# Patient Record
Sex: Female | Born: 1960 | Hispanic: Yes | Marital: Single | Smoking: Current every day smoker
Health system: Southern US, Community
[De-identification: ages and names within clinical notes are randomized; demographics above are authoritative.]

## PROBLEM LIST (undated history)

## (undated) HISTORY — PX: OTHER SURGICAL HISTORY: SHX169

---

## 1997-12-30 ENCOUNTER — Encounter: Admission: RE | Admit: 1997-12-30 | Discharge: 1998-03-30 | Payer: Self-pay | Admitting: Internal Medicine

## 1998-01-16 ENCOUNTER — Ambulatory Visit (HOSPITAL_COMMUNITY): Admission: RE | Admit: 1998-01-16 | Discharge: 1998-01-16 | Payer: Self-pay | Admitting: Gynecology

## 1999-10-20 ENCOUNTER — Other Ambulatory Visit: Admission: RE | Admit: 1999-10-20 | Discharge: 1999-10-20 | Payer: Self-pay | Admitting: Gynecology

## 2000-06-03 ENCOUNTER — Other Ambulatory Visit: Admission: RE | Admit: 2000-06-03 | Discharge: 2000-06-03 | Payer: Self-pay | Admitting: *Deleted

## 2001-05-12 ENCOUNTER — Ambulatory Visit (HOSPITAL_COMMUNITY): Admission: RE | Admit: 2001-05-12 | Discharge: 2001-05-12 | Payer: Self-pay | Admitting: Obstetrics and Gynecology

## 2001-05-12 ENCOUNTER — Encounter: Payer: Self-pay | Admitting: Obstetrics and Gynecology

## 2001-05-18 ENCOUNTER — Encounter: Admission: RE | Admit: 2001-05-18 | Discharge: 2001-08-16 | Payer: Self-pay | Admitting: Obstetrics and Gynecology

## 2001-09-14 ENCOUNTER — Emergency Department (HOSPITAL_COMMUNITY): Admission: EM | Admit: 2001-09-14 | Discharge: 2001-09-14 | Payer: Self-pay | Admitting: Emergency Medicine

## 2001-11-24 ENCOUNTER — Inpatient Hospital Stay (HOSPITAL_COMMUNITY): Admission: AD | Admit: 2001-11-24 | Discharge: 2001-11-27 | Payer: Self-pay | Admitting: Obstetrics & Gynecology

## 2002-10-01 ENCOUNTER — Ambulatory Visit (HOSPITAL_BASED_OUTPATIENT_CLINIC_OR_DEPARTMENT_OTHER): Admission: RE | Admit: 2002-10-01 | Discharge: 2002-10-01 | Payer: Self-pay | Admitting: Orthopedic Surgery

## 2002-10-22 ENCOUNTER — Ambulatory Visit (HOSPITAL_BASED_OUTPATIENT_CLINIC_OR_DEPARTMENT_OTHER): Admission: RE | Admit: 2002-10-22 | Discharge: 2002-10-22 | Payer: Self-pay | Admitting: Orthopedic Surgery

## 2002-11-26 ENCOUNTER — Ambulatory Visit (HOSPITAL_BASED_OUTPATIENT_CLINIC_OR_DEPARTMENT_OTHER): Admission: RE | Admit: 2002-11-26 | Discharge: 2002-11-26 | Payer: Self-pay | Admitting: Orthopedic Surgery

## 2003-12-06 ENCOUNTER — Ambulatory Visit (HOSPITAL_COMMUNITY): Admission: RE | Admit: 2003-12-06 | Discharge: 2003-12-06 | Payer: Self-pay | Admitting: Family Medicine

## 2005-02-17 ENCOUNTER — Emergency Department (HOSPITAL_COMMUNITY): Admission: EM | Admit: 2005-02-17 | Discharge: 2005-02-17 | Payer: Self-pay | Admitting: *Deleted

## 2006-05-29 ENCOUNTER — Emergency Department (HOSPITAL_COMMUNITY): Admission: EM | Admit: 2006-05-29 | Discharge: 2006-05-29 | Payer: Self-pay | Admitting: Emergency Medicine

## 2007-01-15 ENCOUNTER — Ambulatory Visit: Payer: Self-pay | Admitting: Psychiatry

## 2007-01-15 ENCOUNTER — Inpatient Hospital Stay (HOSPITAL_COMMUNITY): Admission: EM | Admit: 2007-01-15 | Discharge: 2007-01-18 | Payer: Self-pay | Admitting: *Deleted

## 2011-12-14 ENCOUNTER — Encounter (HOSPITAL_BASED_OUTPATIENT_CLINIC_OR_DEPARTMENT_OTHER): Payer: Self-pay | Admitting: *Deleted

## 2011-12-14 ENCOUNTER — Emergency Department (HOSPITAL_BASED_OUTPATIENT_CLINIC_OR_DEPARTMENT_OTHER)
Admission: EM | Admit: 2011-12-14 | Discharge: 2011-12-15 | Disposition: A | Discharge status: Home / Self Care | Payer: Medicaid Other | Attending: Emergency Medicine | Admitting: Emergency Medicine

## 2011-12-14 DIAGNOSIS — R112 Nausea with vomiting, unspecified: Secondary | ICD-10-CM | POA: Insufficient documentation

## 2011-12-14 DIAGNOSIS — Z79899 Other long term (current) drug therapy: Secondary | ICD-10-CM | POA: Insufficient documentation

## 2011-12-14 DIAGNOSIS — E119 Type 2 diabetes mellitus without complications: Secondary | ICD-10-CM | POA: Insufficient documentation

## 2011-12-14 DIAGNOSIS — R739 Hyperglycemia, unspecified: Secondary | ICD-10-CM

## 2011-12-14 LAB — COMPREHENSIVE METABOLIC PANEL
ALT: 11 U/L (ref 0–35)
AST: 12 U/L (ref 0–37)
Albumin: 4.3 g/dL (ref 3.5–5.2)
Alkaline Phosphatase: 58 U/L (ref 39–117)
BUN: 16 mg/dL (ref 6–23)
Chloride: 89 mEq/L — ABNORMAL LOW (ref 96–112)
Potassium: 4.1 mEq/L (ref 3.5–5.1)
Sodium: 130 mEq/L — ABNORMAL LOW (ref 135–145)
Total Bilirubin: 1.4 mg/dL — ABNORMAL HIGH (ref 0.3–1.2)
Total Protein: 7.8 g/dL (ref 6.0–8.3)

## 2011-12-14 LAB — URINE MICROSCOPIC-ADD ON

## 2011-12-14 LAB — CBC
Hemoglobin: 14 g/dL (ref 12.0–15.0)
MCH: 30 pg (ref 26.0–34.0)
MCHC: 34.2 g/dL (ref 30.0–36.0)
Platelets: 241 10*3/uL (ref 150–400)

## 2011-12-14 LAB — GLUCOSE, CAPILLARY: Glucose-Capillary: 386 mg/dL — ABNORMAL HIGH (ref 70–99)

## 2011-12-14 LAB — URINALYSIS, ROUTINE W REFLEX MICROSCOPIC
Bilirubin Urine: NEGATIVE
Glucose, UA: >1000 mg/dL — AB
Hgb urine dipstick: NEGATIVE
Ketones, ur: 40 mg/dL — AB
Leukocytes, UA: NEGATIVE
Nitrite: NEGATIVE
Protein, ur: NEGATIVE mg/dL
Specific Gravity, Urine: 1.039 — ABNORMAL HIGH (ref 1.005–1.030)
Urobilinogen, UA: 0.2 mg/dL (ref 0.0–1.0)
pH: 5.5 (ref 5.0–8.0)

## 2011-12-14 LAB — DIFFERENTIAL
Basophils Absolute: 0 10*3/uL (ref 0.0–0.1)
Basophils Relative: 0 % (ref 0–1)
Eosinophils Absolute: 0 10*3/uL (ref 0.0–0.7)
Monocytes Relative: 5 % (ref 3–12)
Neutro Abs: 7.2 10*3/uL (ref 1.7–7.7)
Neutrophils Relative %: 79 % — ABNORMAL HIGH (ref 43–77)

## 2011-12-14 LAB — PREGNANCY, URINE: Preg Test, Ur: NEGATIVE

## 2011-12-14 MED ORDER — SODIUM CHLORIDE 0.9 % IV BOLUS (SEPSIS): 1000.0000 mL | Freq: Once | INTRAVENOUS | Status: DC

## 2011-12-14 NOTE — ED Notes (Signed)
The patient's CBG was 386.

## 2011-12-14 NOTE — ED Notes (Signed)
Pt. Reports being diabetic for 15 yrs.

## 2011-12-14 NOTE — ED Notes (Signed)
Nausea, vomiting for about 1 week. Not able to eat because of this.

## 2011-12-15 MED ORDER — INSULIN REGULAR HUMAN 100 UNIT/ML IJ SOLN
5.0000 [IU] | Freq: Once | INTRAMUSCULAR | Status: AC
  Administered 2011-12-15: 5 [IU] via SUBCUTANEOUS

## 2011-12-15 MED ORDER — ONDANSETRON HCL 4 MG/2ML IJ SOLN
4.0000 mg | Freq: Once | INTRAMUSCULAR | Status: AC
  Administered 2011-12-15: 4 mg via INTRAVENOUS
  Filled 2011-12-15: qty 2

## 2011-12-15 MED ORDER — INSULIN REGULAR HUMAN 100 UNIT/ML IJ SOLN
INTRAMUSCULAR | Status: AC
  Filled 2011-12-15: qty 1

## 2011-12-15 MED ORDER — ONDANSETRON 8 MG PO TBDP: 8.0000 mg | ORAL_TABLET | Freq: Three times a day (TID) | ORAL | Status: AC | PRN

## 2011-12-15 NOTE — ED Provider Notes (Signed)
History     CSN: [id number]  Arrival date & time 12/14/11  2104   First MD Initiated Contact with Patient 12/15/11 0029      Chief Complaint  Patient presents with  . Nausea and vomiting     (Consider location/radiation/quality/duration/timing/severity/associated sxs/prior treatment) HPI Is a 51 year old female with a history of diabetes. She has had vomiting for the past 8 days. She states she has vomited a total of about 11 times. There's been no associated abdominal pain or diarrhea. She has not even in about 4 days due to the vomiting. She has not taken anything to treat the vomiting. She states she has had a headache but denies other associated symptoms. She was given a liter of normal saline by nursing triage protocols on arrival. She states she feels back to normal and wishes to go home at this time. She now just that she has not taken her medications the last day or 2 and attributes her elevated sugar to this.  Past Medical History  Diagnosis Date  . Diabetes mellitus     Past Surgical History  Procedure Date  . Carpel tunnel   . Cesarean section     No family history on file.  History  Substance Use Topics  . Smoking status: Not on file  . Smokeless tobacco: Not on file  . Alcohol Use:     OB History    Grav Para Term Preterm Abortions TAB SAB Ect Mult Living                  Review of Systems  All other systems reviewed and are negative.    Allergies  Review of patient's allergies indicates no known allergies.  Home Medications   Current Outpatient Rx  Name Route Sig Dispense Refill  . DULOXETINE HCL 60 MG PO CPEP Oral Take 60 mg by mouth daily.    Marland Kitchen GLIPIZIDE ER 10 MG PO TB24 Oral Take 10 mg by mouth daily.    Marland Kitchen METFORMIN HCL 1000 MG PO TABS Oral Take 1,000 mg by mouth 2 (two) times daily with a meal.    . PIOGLITAZONE HCL 45 MG PO TABS Oral Take 45 mg by mouth daily.    . TRAZODONE HCL 100 MG PO TABS Oral Take 100 mg by mouth at bedtime.       BP 126/43  Pulse 86  Temp(Src) 98.1 F (36.7 C) (Oral)  Resp 16  SpO2 100%  Physical Exam General: Well-developed, well-nourished female in no acute distress; appearance consistent with age of record HENT: normocephalic, atraumatic Eyes: pupils equal round and reactive to light; extraocular muscles intact Neck: supple Heart: regular rate and rhythm Lungs: clear to auscultation bilaterally Abdomen: soft; nondistended; nontender; no masses or hepatosplenomegaly; bowel sounds present Extremities: No deformity; full range of motion Neurologic: Awake, alert and oriented; motor function intact in all extremities and symmetric; no facial droop Skin: Warm and dry Psychiatric: Normal mood and affect    ED Course  Procedures (including critical care time)     MDM   Nursing notes and vitals signs, including pulse oximetry, reviewed.  Summary of this visit's results, reviewed by myself:  Labs:  Results for orders placed during the hospital encounter of 12/14/11  URINALYSIS, ROUTINE W REFLEX MICROSCOPIC      Component Value Range   Color, Urine YELLOW  YELLOW    APPearance CLEAR  CLEAR    Specific Gravity, Urine 1.039 (*) 1.005 - 1.030    pH  5.5  5.0 - 8.0    Glucose, UA >1000 (*) NEGATIVE (mg/dL)   Hgb urine dipstick NEGATIVE  NEGATIVE    Bilirubin Urine NEGATIVE  NEGATIVE    Ketones, ur 40 (*) NEGATIVE (mg/dL)   Protein, ur NEGATIVE  NEGATIVE (mg/dL)   Urobilinogen, UA 0.2  0.0 - 1.0 (mg/dL)   Nitrite NEGATIVE  NEGATIVE    Leukocytes, UA NEGATIVE  NEGATIVE   PREGNANCY, URINE      Component Value Range   Preg Test, Ur NEGATIVE  NEGATIVE   GLUCOSE, CAPILLARY      Component Value Range   Glucose-Capillary 386 (*) 70 - 99 (mg/dL)   Comment 1 Notify RN     Comment 2 Documented in Chart    URINE MICROSCOPIC-ADD ON      Component Value Range   Squamous Epithelial / LPF RARE  RARE    Bacteria, UA RARE  RARE   CBC      Component Value Range   WBC 9.2  4.0 - 10.5  (K/uL)   RBC 4.67  3.87 - 5.11 (MIL/uL)   Hemoglobin 14.0  12.0 - 15.0 (g/dL)   HCT 16.1  09.6 - 04.5 (%)   MCV 87.6  78.0 - 100.0 (fL)   MCH 30.0  26.0 - 34.0 (pg)   MCHC 34.2  30.0 - 36.0 (g/dL)   RDW 40.9  81.1 - 91.4 (%)   Platelets 241  150 - 400 (K/uL)  DIFFERENTIAL      Component Value Range   Neutrophils Relative 79 (*) 43 - 77 (%)   Neutro Abs 7.2  1.7 - 7.7 (K/uL)   Lymphocytes Relative 15  12 - 46 (%)   Lymphs Abs 1.4  0.7 - 4.0 (K/uL)   Monocytes Relative 5  3 - 12 (%)   Monocytes Absolute 0.5  0.1 - 1.0 (K/uL)   Eosinophils Relative 0  0 - 5 (%)   Eosinophils Absolute 0.0  0.0 - 0.7 (K/uL)   Basophils Relative 0  0 - 1 (%)   Basophils Absolute 0.0  0.0 - 0.1 (K/uL)  COMPREHENSIVE METABOLIC PANEL      Component Value Range   Sodium 130 (*) 135 - 145 (mEq/L)   Potassium 4.1  3.5 - 5.1 (mEq/L)   Chloride 89 (*) 96 - 112 (mEq/L)   CO2 33 (*) 19 - 32 (mEq/L)   Glucose, Bld 394 (*) 70 - 99 (mg/dL)   BUN 16  6 - 23 (mg/dL)   Creatinine, Ser 7.82  0.50 - 1.10 (mg/dL)   Calcium 9.9  8.4 - 95.6 (mg/dL)   Total Protein 7.8  6.0 - 8.3 (g/dL)   Albumin 4.3  3.5 - 5.2 (g/dL)   AST 12  0 - 37 (U/L)   ALT 11  0 - 35 (U/L)   Alkaline Phosphatase 58  39 - 117 (U/L)   Total Bilirubin 1.4 (*) 0.3 - 1.2 (mg/dL)   GFR calc non Af Amer 84 (*) >90 (mL/min)   GFR calc Af Amer >90  >90 (mL/min)    Imaging Studies: No results found.          Hanley Seamen, MD 12/15/11 [phone]

## 2011-12-15 NOTE — ED Notes (Signed)
After fluid bolus pt states feeling much better and nausea improved. Pt states she is ready to go home. Pt declines additional liter of NS IV.

## 2012-07-16 ENCOUNTER — Emergency Department (HOSPITAL_BASED_OUTPATIENT_CLINIC_OR_DEPARTMENT_OTHER)
Admission: EM | Admit: 2012-07-16 | Discharge: 2012-07-17 | Disposition: A | Discharge status: Home / Self Care | Payer: Medicaid Other | Attending: Emergency Medicine | Admitting: Emergency Medicine

## 2012-07-16 ENCOUNTER — Encounter (HOSPITAL_BASED_OUTPATIENT_CLINIC_OR_DEPARTMENT_OTHER): Payer: Self-pay | Admitting: *Deleted

## 2012-07-16 ENCOUNTER — Emergency Department (HOSPITAL_BASED_OUTPATIENT_CLINIC_OR_DEPARTMENT_OTHER): Payer: Medicaid Other

## 2012-07-16 DIAGNOSIS — Z79899 Other long term (current) drug therapy: Secondary | ICD-10-CM | POA: Insufficient documentation

## 2012-07-16 DIAGNOSIS — S97109A Crushing injury of unspecified toe(s), initial encounter: Secondary | ICD-10-CM | POA: Insufficient documentation

## 2012-07-16 DIAGNOSIS — Y92009 Unspecified place in unspecified non-institutional (private) residence as the place of occurrence of the external cause: Secondary | ICD-10-CM | POA: Insufficient documentation

## 2012-07-16 DIAGNOSIS — E119 Type 2 diabetes mellitus without complications: Secondary | ICD-10-CM | POA: Insufficient documentation

## 2012-07-16 DIAGNOSIS — W208XXA Other cause of strike by thrown, projected or falling object, initial encounter: Secondary | ICD-10-CM | POA: Insufficient documentation

## 2012-07-16 DIAGNOSIS — Z23 Encounter for immunization: Secondary | ICD-10-CM | POA: Insufficient documentation

## 2012-07-16 MED ORDER — BUPIVACAINE HCL 0.5 % IJ SOLN
50.0000 mL | Freq: Once | INTRAMUSCULAR | Status: AC
  Administered 2012-07-16: 1 mL
  Filled 2012-07-16: qty 1

## 2012-07-16 NOTE — ED Notes (Signed)
Pt states she dropped a table on her right foot. C/O pain to same. Bleeding at site.

## 2012-07-16 NOTE — ED Provider Notes (Signed)
History  This chart was scribed for Hanley Seamen, MD by Shari Heritage. The patient was seen in room MH12/MH12. Patient's care was started at 2334.    CSN: [id number]  Arrival date & time 07/16/12  2309   First MD Initiated Contact with Patient 07/16/12 2334      Chief Complaint  Patient presents with  . Foot Injury    The history is provided by the patient. No language interpreter was used.    Victoria Shaffer is a 51 y.o. female who presents to the Emergency Department complaining of mild to severe, constant right toe pain and foot pain proximal to second toe onset 1-2 hours ago when she dropped a table on her foot. Patient states that pain is worse when walking. Patient denies any other symptoms. She hasnt taken any medications for pain relief at home. Patient has a history of diabetes. Patient is a current every day smoker.  Past Medical History  Diagnosis Date  . Diabetes mellitus     Past Surgical History  Procedure Date  . Carpel tunnel   . Cesarean section     History reviewed. No pertinent family history.  History  Substance Use Topics  . Smoking status: Current Every Day Smoker  . Smokeless tobacco: Not on file  . Alcohol Use: No    OB History    Grav Para Term Preterm Abortions TAB SAB Ect Mult Living                  Review of Systems A complete 10 system review of systems was obtained and all systems are negative except as noted in the HPI and PMH.   Allergies  Review of patient's allergies indicates no known allergies.  Home Medications   Current Outpatient Rx  Name Route Sig Dispense Refill  . NOVOLOG MIX 70/30 [place] Subcutaneous Inject into the skin.    . DULOXETINE HCL 60 MG PO CPEP Oral Take 60 mg by mouth daily.    Marland Kitchen GLIPIZIDE ER 10 MG PO TB24 Oral Take 10 mg by mouth daily.    Marland Kitchen METFORMIN HCL 1000 MG PO TABS Oral Take 1,000 mg by mouth 2 (two) times daily with a meal.    . PIOGLITAZONE HCL 45 MG PO TABS Oral Take 45 mg by mouth daily.      . TRAZODONE HCL 100 MG PO TABS Oral Take 100 mg by mouth at bedtime.      BP 129/92  Pulse 92  Temp 98.6 F (37 C) (Oral)  Resp 20  Ht 5\' 6"  (1.676 m)  Wt 150 lb (68.04 kg)  BMI 24.21 kg/m2  SpO2 99%  LMP 07/02/2012  Physical Exam  Nursing note and vitals reviewed. Constitutional: She is oriented to person, place, and time. She appears well-developed and well-nourished.  HENT:  Head: Normocephalic and atraumatic.  Eyes: EOM are normal. Pupils are equal, round, and reactive to light.  Cardiovascular: Normal rate and regular rhythm.   No murmur heard. Pulmonary/Chest: Effort normal and breath sounds normal. No respiratory distress. She has no wheezes. She has no rales.  Abdominal: Soft. Bowel sounds are normal. She exhibits no distension. There is no tenderness.  Musculoskeletal: Normal range of motion.       3 mm partial avulsion of the lateral side of the right 1st toenail with bleeding underneath.  Good sensation in right great toe. Good DP pulse in right foot.  Neurological: She is alert and oriented to person, place, and  time.  Skin: Skin is warm and dry.       Good capillary refills in right toes.   Psychiatric: She has a normal mood and affect. Her behavior is normal.    ED Course  Procedures (including critical care time) DIAGNOSTIC STUDIES: Oxygen Saturation is 99% on room air, normal by my interpretation.    COORDINATION OF CARE: 2343- Patient informed of current plan for treatment and evaluation and agrees with plan at this time.   NAIL TREATMENT Verbal consent was obtained from the patient. The patient's right great toe was anesthetized by performing a digital block with 5 mL of 0.5% bupivacaine without epinephrine.  Once adequate anesthesia was obtained the partially avulsed nail was reseated in the nail fold. The fractured distal aspect of the right great toenail was trimmed off with nail clippers. The patient tolerated this well there are no complications.  Some bleeding was noted from the base of the nail.   MDM   Nursing notes and vitals signs, including pulse oximetry, reviewed.  Summary of this visit's results, reviewed by myself:  Imaging Studies: Dg Foot Complete Right  2012/08/02  *RADIOLOGY REPORT*  Clinical Data: Injury to the right foot.  RIGHT FOOT COMPLETE - 3+ VIEW  Comparison: 12/26/2007  Findings: Three views of the right foot were obtained.  Negative for an acute fracture or dislocation.  Normal alignment to the right foot.  No gross soft tissue abnormality.  IMPRESSION: No acute bony abnormality.   Original Report Authenticated By: Richarda Overlie, M.D.           I personally performed the services described in this documentation, which was scribed in my presence.  The recorded information has been reviewed and considered.    Hanley Seamen, MD 2012/08/02 0030

## 2012-07-17 MED ORDER — HYDROCODONE-ACETAMINOPHEN 5-325 MG PO TABS: 1.0000 | ORAL_TABLET | Freq: Four times a day (QID) | ORAL | Status: DC | PRN

## 2012-07-17 MED ORDER — CEPHALEXIN 250 MG PO CAPS
500.0000 mg | ORAL_CAPSULE | Freq: Once | ORAL | Status: AC
  Administered 2012-07-17: 500 mg via ORAL
  Filled 2012-07-17: qty 2

## 2012-07-17 MED ORDER — TETANUS-DIPHTH-ACELL PERTUSSIS 5-2.5-18.5 LF-MCG/0.5 IM SUSP
0.5000 mL | Freq: Once | INTRAMUSCULAR | Status: AC
  Administered 2012-07-17: 0.5 mL via INTRAMUSCULAR
  Filled 2012-07-17: qty 0.5

## 2012-07-17 MED ORDER — CEPHALEXIN 500 MG PO CAPS: 500.0000 mg | ORAL_CAPSULE | Freq: Four times a day (QID) | ORAL | Status: DC

## 2012-07-17 NOTE — ED Notes (Signed)
I completed wound care with first applying petrolatum non-adherent gauze tri-folded to match toe length. I gently wrapped same and over-wrapped with kerlix. I then applied folded cotton webril between each toe, then wrapped entire foot loosely with large kerlix, then secured with tape, applied large hospital sock and fit women's large post-op shoe. Per husband's interpretation, patient comfort was improved. I gave patient additional wound care supplies.

## 2012-10-31 ENCOUNTER — Other Ambulatory Visit (HOSPITAL_COMMUNITY): Payer: Self-pay | Admitting: Obstetrics

## 2012-10-31 DIAGNOSIS — Z1231 Encounter for screening mammogram for malignant neoplasm of breast: Secondary | ICD-10-CM

## 2012-11-02 ENCOUNTER — Ambulatory Visit (HOSPITAL_COMMUNITY)
Admission: RE | Admit: 2012-11-02 | Discharge: 2012-11-02 | Disposition: A | Discharge status: Home / Self Care | Payer: Medicaid Other | Source: Ambulatory Visit | Attending: Obstetrics | Admitting: Obstetrics

## 2012-11-02 DIAGNOSIS — Z1231 Encounter for screening mammogram for malignant neoplasm of breast: Secondary | ICD-10-CM

## 2014-04-15 ENCOUNTER — Encounter (HOSPITAL_BASED_OUTPATIENT_CLINIC_OR_DEPARTMENT_OTHER): Payer: Self-pay | Admitting: Emergency Medicine

## 2014-04-15 ENCOUNTER — Emergency Department (HOSPITAL_BASED_OUTPATIENT_CLINIC_OR_DEPARTMENT_OTHER)
Admission: EM | Admit: 2014-04-15 | Discharge: 2014-04-16 | Disposition: A | Discharge status: Home / Self Care | Payer: Medicaid Other | Attending: Emergency Medicine | Admitting: Emergency Medicine

## 2014-04-15 DIAGNOSIS — Z79899 Other long term (current) drug therapy: Secondary | ICD-10-CM | POA: Diagnosis not present

## 2014-04-15 DIAGNOSIS — M533 Sacrococcygeal disorders, not elsewhere classified: Secondary | ICD-10-CM | POA: Insufficient documentation

## 2014-04-15 DIAGNOSIS — Y929 Unspecified place or not applicable: Secondary | ICD-10-CM | POA: Diagnosis not present

## 2014-04-15 DIAGNOSIS — Y9389 Activity, other specified: Secondary | ICD-10-CM | POA: Insufficient documentation

## 2014-04-15 DIAGNOSIS — E119 Type 2 diabetes mellitus without complications: Secondary | ICD-10-CM | POA: Diagnosis not present

## 2014-04-15 DIAGNOSIS — IMO0002 Reserved for concepts with insufficient information to code with codable children: Secondary | ICD-10-CM | POA: Diagnosis present

## 2014-04-15 DIAGNOSIS — Z792 Long term (current) use of antibiotics: Secondary | ICD-10-CM | POA: Diagnosis not present

## 2014-04-15 DIAGNOSIS — F17200 Nicotine dependence, unspecified, uncomplicated: Secondary | ICD-10-CM | POA: Insufficient documentation

## 2014-04-15 DIAGNOSIS — W108XXA Fall (on) (from) other stairs and steps, initial encounter: Secondary | ICD-10-CM | POA: Insufficient documentation

## 2014-04-15 NOTE — ED Provider Notes (Addendum)
CSN: [id number]     Arrival date & time 04/15/14  2345 History  This chart was scribed for Hanley Seamen, MD by Nicholos Johns, ED scribe. This patient was seen in room MH05/MH05 and the patient's care was started at 11:58 PM.    Chief Complaint  Patient presents with  . Tailbone Injury    The history is provided by the patient. A language interpreter was used.   HPI Comments: Victoria Shaffer is a 53 y.o. female who presents to the Emergency Department complaining of gradually worsening constant sacral pain at the coccyx; onset 5 days ago. Pain worse with sitting, laying down, and rapid movements. No relief with any positioning. Pt states she tripped down two stairs and landed on her tailbone. Has used acetaminophen without relief.  No other injuries to report from that incident. Denies neck pain, back pain, or abdominal pain.  Past Medical History  Diagnosis Date  . Diabetes mellitus    Past Surgical History  Procedure Laterality Date  . Carpel tunnel    . Cesarean section     History reviewed. No pertinent family history. History  Substance Use Topics  . Smoking status: Current Every Day Smoker  . Smokeless tobacco: Not on file  . Alcohol Use: No   OB History   Grav Para Term Preterm Abortions TAB SAB Ect Mult Living                 Review of Systems  Gastrointestinal: Negative for abdominal pain.  Musculoskeletal: Negative for back pain and neck pain.   Allergies  Review of patient's allergies indicates no known allergies.  Home Medications   Prior to Admission medications   Medication Sig Start Date End Date Taking? Authorizing Provider  atorvastatin (LIPITOR) 20 MG tablet Take 20 mg by mouth daily.   Yes Historical Provider, MD  DULoxetine (CYMBALTA) 60 MG capsule Take 60 mg by mouth daily.   Yes Historical Provider, MD  levothyroxine (SYNTHROID, LEVOTHROID) 100 MCG tablet Take 100 mcg by mouth daily before breakfast.   Yes Historical Provider, MD  metFORMIN  (GLUCOPHAGE) 1000 MG tablet Take 1,000 mg by mouth 2 (two) times daily with a meal.   Yes Historical Provider, MD  sitaGLIPtin (JANUVIA) 50 MG tablet Take 50 mg by mouth daily.   Yes Historical Provider, MD  cephALEXin (KEFLEX) 500 MG capsule Take 1 capsule (500 mg total) by mouth 4 (four) times daily. 07/17/12   Makoa Satz L Liviah Cake, MD  glipiZIDE (GLUCOTROL XL) 10 MG 24 hr tablet Take 10 mg by mouth daily.    Historical Provider, MD  HYDROcodone-acetaminophen (NORCO/VICODIN) 5-325 MG per tablet Take 1-2 tablets by mouth every 6 (six) hours as needed for pain. 07/17/12   Carlisle Beers Miraya Cudney, MD  HYDROcodone-acetaminophen (NORCO/VICODIN) 5-325 MG per tablet Take 1-2 tablets by mouth every 6 (six) hours as needed. 04/16/14   Carlisle Beers Quinterius Gaida, MD  Insulin Aspart Prot & Aspart (NOVOLOG MIX 70/30 [place]) Inject into the skin.    Historical Provider, MD  pioglitazone (ACTOS) 45 MG tablet Take 45 mg by mouth daily.    Historical Provider, MD  traZODone (DESYREL) 100 MG tablet Take 100 mg by mouth at bedtime.    Historical Provider, MD   Triage vitals: BP 128/60  Pulse 91  Temp(Src) 98.1 F (36.7 C) (Oral)  Resp 18  SpO2 100%  Physical Exam  Nursing note and vitals reviewed.  General: Well-developed, well-nourished female in no acute distress; appearance consistent with age  of record HENT: normocephalic; atraumatic Eyes: pupils equal, round and reactive to light; extraocular muscles intact Neck: supple Heart: regular rate and rhythm; no murmurs, rubs or gallops Lungs: clear to auscultation bilaterally Abdomen: soft; nondistended; nontender; no masses or hepatosplenomegaly; bowel sounds present Extremities: No deformity; full range of motion; pulses normal Neurologic: Awake, alert and oriented; motor function intact in all extremities and symmetric; no facial droop Skin: Warm and dry Psychiatric: Normal mood and affect  Back: No thoracic or lumbar tenderness. Definite coccygeal tenderness.   ED Course   Procedures (including critical care time) DIAGNOSTIC STUDIES: Oxygen Saturation is 100% on room air, normal by my interpretation.    COORDINATION OF CARE: At 12:02 AM: Discussed treatment plan with patient which includes x-ray of the sacrum. Patient agrees.     MDM  Nursing notes and vitals signs, including pulse oximetry, reviewed.  Summary of this visit's results, reviewed by myself:  Imaging Studies: Dg Sacrum/coccyx  04/16/2014   CLINICAL DATA:  Status post fall 5 days ago, landing on the sacrum and coccyx. Concern for fracture.  EXAM: SACRUM AND COCCYX - 2+ VIEW  COMPARISON:  None.  FINDINGS: There is no evidence of fracture or dislocation. The sacrum and coccyx appear grossly intact, and demonstrate normal alignment. Mild sclerotic change is noted at the sacroiliac joints. The visualized bowel gas pattern is grossly unremarkable.  IMPRESSION: No evidence of fracture or dislocation.   Electronically Signed   By: Roanna RaiderJeffery  Chang M.D.   On: 04/16/2014 00:50   Patient advised that narcotic pain medication can cause constipation, which may worsen her pain. She was advised to take a laxative as a precaution.   I personally performed the services described in this documentation, which was scribed in my presence. The recorded information has been reviewed and is accurate.     Hanley SeamenJohn L Gitel Beste, MD 04/16/14 [id number]  Hanley SeamenJohn L Malyia Moro, MD 04/16/14 [phone]

## 2014-04-15 NOTE — ED Notes (Signed)
Pt reports falling down two stairs and landing on her tailbone, pain has increased since that day.

## 2014-04-16 ENCOUNTER — Emergency Department (HOSPITAL_BASED_OUTPATIENT_CLINIC_OR_DEPARTMENT_OTHER): Payer: Medicaid Other

## 2014-04-16 MED ORDER — HYDROCODONE-ACETAMINOPHEN 5-325 MG PO TABS: 1.0000 | ORAL_TABLET | Freq: Four times a day (QID) | ORAL | Status: DC | PRN

## 2014-04-16 MED ORDER — HYDROCODONE-ACETAMINOPHEN 5-325 MG PO TABS
1.0000 | ORAL_TABLET | Freq: Once | ORAL | Status: AC
  Administered 2014-04-16: 1 via ORAL
  Filled 2014-04-16: qty 1

## 2014-04-17 ENCOUNTER — Emergency Department (HOSPITAL_BASED_OUTPATIENT_CLINIC_OR_DEPARTMENT_OTHER)
Admission: EM | Admit: 2014-04-17 | Discharge: 2014-04-17 | Disposition: A | Discharge status: Home / Self Care | Payer: Medicaid Other | Attending: Emergency Medicine | Admitting: Emergency Medicine

## 2014-04-17 ENCOUNTER — Encounter (HOSPITAL_BASED_OUTPATIENT_CLINIC_OR_DEPARTMENT_OTHER): Payer: Self-pay | Admitting: Emergency Medicine

## 2014-04-17 DIAGNOSIS — E119 Type 2 diabetes mellitus without complications: Secondary | ICD-10-CM | POA: Diagnosis not present

## 2014-04-17 DIAGNOSIS — Z792 Long term (current) use of antibiotics: Secondary | ICD-10-CM | POA: Diagnosis not present

## 2014-04-17 DIAGNOSIS — M549 Dorsalgia, unspecified: Secondary | ICD-10-CM | POA: Diagnosis present

## 2014-04-17 DIAGNOSIS — Z79899 Other long term (current) drug therapy: Secondary | ICD-10-CM | POA: Insufficient documentation

## 2014-04-17 DIAGNOSIS — Z794 Long term (current) use of insulin: Secondary | ICD-10-CM | POA: Diagnosis not present

## 2014-04-17 DIAGNOSIS — F17200 Nicotine dependence, unspecified, uncomplicated: Secondary | ICD-10-CM | POA: Diagnosis not present

## 2014-04-17 DIAGNOSIS — S3992XD Unspecified injury of lower back, subsequent encounter: Secondary | ICD-10-CM

## 2014-04-17 DIAGNOSIS — M533 Sacrococcygeal disorders, not elsewhere classified: Secondary | ICD-10-CM | POA: Insufficient documentation

## 2014-04-17 MED ORDER — METHOCARBAMOL 500 MG PO TABS: 500.0000 mg | ORAL_TABLET | Freq: Two times a day (BID) | ORAL | Status: DC

## 2014-04-17 MED ORDER — KETOROLAC TROMETHAMINE 60 MG/2ML IM SOLN
60.0000 mg | Freq: Once | INTRAMUSCULAR | Status: AC
  Administered 2014-04-17: 60 mg via INTRAMUSCULAR
  Filled 2014-04-17: qty 2

## 2014-04-17 MED ORDER — OXYCODONE-ACETAMINOPHEN 5-325 MG PO TABS: 1.0000 | ORAL_TABLET | ORAL | Status: DC | PRN

## 2014-04-17 NOTE — Discharge Instructions (Signed)
Take percocet as needed for pain. Take Robaxin as needed for muscle spasm. You may take these medications together. Refer to attached documents for more information.  °

## 2014-04-17 NOTE — ED Provider Notes (Signed)
CSN: [id number]     Arrival date & time 04/17/14  1245 History   First MD Initiated Contact with Patient 04/17/14 1257     Chief Complaint  Patient presents with  . Back Pain     (Consider location/radiation/quality/duration/timing/severity/associated sxs/prior Treatment) HPI Comments: Patient is a 53 year old female with a past medical history of diabetes who presents with sacral pain that started 9 days ago after she fell and landed on her sacrum. Patient was seen 2 days ago here where she had a negative xray and was given Vicodin. Patient reports no relief from the Vicodin and it makes her "dizzy." Patient reports continued aching and severe pain without radiation. Patient denies any new injury. Movement and palpation makes the pain worse. No alleviating factors.    Past Medical History  Diagnosis Date  . Diabetes mellitus    Past Surgical History  Procedure Laterality Date  . Carpel tunnel    . Cesarean section     History reviewed. No pertinent family history. History  Substance Use Topics  . Smoking status: Current Every Day Smoker  . Smokeless tobacco: Not on file  . Alcohol Use: No   OB History   Grav Para Term Preterm Abortions TAB SAB Ect Mult Living                 Review of Systems  Constitutional: Negative for fever, chills and fatigue.  HENT: Negative for trouble swallowing.   Eyes: Negative for visual disturbance.  Respiratory: Negative for shortness of breath.   Cardiovascular: Negative for chest pain and palpitations.  Gastrointestinal: Negative for nausea, vomiting, abdominal pain and diarrhea.  Genitourinary: Negative for dysuria and difficulty urinating.  Musculoskeletal: Positive for back pain. Negative for arthralgias and neck pain.  Skin: Negative for color change.  Neurological: Negative for dizziness and weakness.  Psychiatric/Behavioral: Negative for dysphoric mood.      Allergies  Review of patient's allergies indicates no known  allergies.  Home Medications   Prior to Admission medications   Medication Sig Start Date End Date Taking? Authorizing Provider  atorvastatin (LIPITOR) 20 MG tablet Take 20 mg by mouth daily.    Historical Provider, MD  cephALEXin (KEFLEX) 500 MG capsule Take 1 capsule (500 mg total) by mouth 4 (four) times daily. 07/17/12   John L Molpus, MD  DULoxetine (CYMBALTA) 60 MG capsule Take 60 mg by mouth daily.    Historical Provider, MD  glipiZIDE (GLUCOTROL XL) 10 MG 24 hr tablet Take 10 mg by mouth daily.    Historical Provider, MD  HYDROcodone-acetaminophen (NORCO/VICODIN) 5-325 MG per tablet Take 1-2 tablets by mouth every 6 (six) hours as needed for pain. 07/17/12   Carlisle Beers Molpus, MD  HYDROcodone-acetaminophen (NORCO/VICODIN) 5-325 MG per tablet Take 1-2 tablets by mouth every 6 (six) hours as needed. 04/16/14   Carlisle Beers Molpus, MD  Insulin Aspart Prot & Aspart (NOVOLOG MIX 70/30 [place]) Inject into the skin.    Historical Provider, MD  levothyroxine (SYNTHROID, LEVOTHROID) 100 MCG tablet Take 100 mcg by mouth daily before breakfast.    Historical Provider, MD  metFORMIN (GLUCOPHAGE) 1000 MG tablet Take 1,000 mg by mouth 2 (two) times daily with a meal.    Historical Provider, MD  pioglitazone (ACTOS) 45 MG tablet Take 45 mg by mouth daily.    Historical Provider, MD  sitaGLIPtin (JANUVIA) 50 MG tablet Take 50 mg by mouth daily.    Historical Provider, MD  traZODone (DESYREL) 100 MG tablet Take  100 mg by mouth at bedtime.    Historical Provider, MD   BP 110/62  Pulse 81  Temp(Src) 98.4 F (36.9 C) (Oral)  Resp 16  Wt 150 lb (68.04 kg)  SpO2 100% Physical Exam  Nursing note and vitals reviewed. Constitutional: She appears well-developed and well-nourished. No distress.  HENT:  Head: Normocephalic and atraumatic.  Eyes: Conjunctivae and EOM are normal.  Neck: Normal range of motion.  Cardiovascular: Normal rate and regular rhythm.  Exam reveals no gallop and no friction rub.   No murmur  heard. Pulmonary/Chest: Effort normal and breath sounds normal. She has no wheezes. She has no rales. She exhibits no tenderness.  Musculoskeletal: Normal range of motion.  Sacral tenderness to palpation. No other midline spine tenderness to palpation.   Neurological: She is alert.  Lower extremity strength and sensation equal and intact bilaterally. Speech is goal-oriented. Moves limbs without ataxia.   Skin: Skin is warm and dry.  Psychiatric: She has a normal mood and affect. Her behavior is normal.    ED Course  Procedures (including critical care time) Labs Review Labs Reviewed - No data to display  Imaging Review Dg Sacrum/coccyx  04/16/2014   CLINICAL DATA:  Status post fall 5 days ago, landing on the sacrum and coccyx. Concern for fracture.  EXAM: SACRUM AND COCCYX - 2+ VIEW  COMPARISON:  None.  FINDINGS: There is no evidence of fracture or dislocation. The sacrum and coccyx appear grossly intact, and demonstrate normal alignment. Mild sclerotic change is noted at the sacroiliac joints. The visualized bowel gas pattern is grossly unremarkable.  IMPRESSION: No evidence of fracture or dislocation.   Electronically Signed   By: Roanna RaiderJeffery  Chang M.D.   On: 04/16/2014 00:50     EKG Interpretation None      MDM   Final diagnoses:  Injury of sacrum, subsequent encounter    1:17 PM Patient's xray from 2 days ago shows no fracture. Patient is requesting a shot here. I will order IM toradol and prescribe percocet for pain. No bladder/bowel incontinence or saddle paresthesias.     Emilia BeckKaitlyn Tyronza Happe, PA-C 04/17/14 1327

## 2014-04-17 NOTE — ED Notes (Signed)
Pt c/o lower back pain , see here 7/20 for same no relief with vicodin

## 2014-04-17 NOTE — ED Provider Notes (Signed)
Medical screening examination/treatment/procedure(s) were performed by non-physician practitioner and as supervising physician I was immediately available for consultation/collaboration.     Sheva Mcdougle, MD 04/17/14 1443 

## 2014-05-13 ENCOUNTER — Emergency Department (HOSPITAL_BASED_OUTPATIENT_CLINIC_OR_DEPARTMENT_OTHER)
Admission: EM | Admit: 2014-05-13 | Discharge: 2014-05-13 | Disposition: A | Discharge status: Home / Self Care | Payer: Medicaid Other | Attending: Emergency Medicine | Admitting: Emergency Medicine

## 2014-05-13 ENCOUNTER — Encounter (HOSPITAL_BASED_OUTPATIENT_CLINIC_OR_DEPARTMENT_OTHER): Payer: Self-pay | Admitting: Emergency Medicine

## 2014-05-13 DIAGNOSIS — E119 Type 2 diabetes mellitus without complications: Secondary | ICD-10-CM | POA: Insufficient documentation

## 2014-05-13 DIAGNOSIS — Z792 Long term (current) use of antibiotics: Secondary | ICD-10-CM | POA: Insufficient documentation

## 2014-05-13 DIAGNOSIS — F17200 Nicotine dependence, unspecified, uncomplicated: Secondary | ICD-10-CM | POA: Insufficient documentation

## 2014-05-13 DIAGNOSIS — Z794 Long term (current) use of insulin: Secondary | ICD-10-CM | POA: Insufficient documentation

## 2014-05-13 DIAGNOSIS — M546 Pain in thoracic spine: Secondary | ICD-10-CM | POA: Insufficient documentation

## 2014-05-13 DIAGNOSIS — Z79899 Other long term (current) drug therapy: Secondary | ICD-10-CM | POA: Diagnosis not present

## 2014-05-13 DIAGNOSIS — R6883 Chills (without fever): Secondary | ICD-10-CM | POA: Diagnosis not present

## 2014-05-13 LAB — BASIC METABOLIC PANEL
Anion gap: 14 (ref 5–15)
BUN: 28 mg/dL — AB (ref 6–23)
CHLORIDE: 99 meq/L (ref 96–112)
CO2: 26 mEq/L (ref 19–32)
Calcium: 9.4 mg/dL (ref 8.4–10.5)
Creatinine, Ser: 1.5 mg/dL — ABNORMAL HIGH (ref 0.50–1.10)
GFR calc non Af Amer: 39 mL/min — ABNORMAL LOW (ref >90)
GFR, EST AFRICAN AMERICAN: 45 mL/min — AB (ref >90)
Glucose, Bld: 247 mg/dL — ABNORMAL HIGH (ref 70–99)
POTASSIUM: 4.3 meq/L (ref 3.7–5.3)
SODIUM: 139 meq/L (ref 137–147)

## 2014-05-13 LAB — CBC WITH DIFFERENTIAL/PLATELET
BASOS PCT: 0 % (ref 0–1)
Basophils Absolute: 0 10*3/uL (ref 0.0–0.1)
Eosinophils Absolute: 0.2 10*3/uL (ref 0.0–0.7)
Eosinophils Relative: 2 % (ref 0–5)
HCT: 32.7 % — ABNORMAL LOW (ref 36.0–46.0)
HEMOGLOBIN: 11 g/dL — AB (ref 12.0–15.0)
LYMPHS ABS: 2.4 10*3/uL (ref 0.7–4.0)
Lymphocytes Relative: 35 % (ref 12–46)
MCH: 29.9 pg (ref 26.0–34.0)
MCHC: 33.6 g/dL (ref 30.0–36.0)
MCV: 88.9 fL (ref 78.0–100.0)
MONOS PCT: 9 % (ref 3–12)
Monocytes Absolute: 0.6 10*3/uL (ref 0.1–1.0)
NEUTROS ABS: 3.6 10*3/uL (ref 1.7–7.7)
NEUTROS PCT: 53 % (ref 43–77)
Platelets: 217 10*3/uL (ref 150–400)
RBC: 3.68 MIL/uL — AB (ref 3.87–5.11)
RDW: 12 % (ref 11.5–15.5)
WBC: 6.7 10*3/uL (ref 4.0–10.5)

## 2014-05-13 MED ORDER — HYDROCODONE-ACETAMINOPHEN 5-325 MG PO TABS: 1.0000 | ORAL_TABLET | ORAL | Status: DC | PRN

## 2014-05-13 MED ORDER — IBUPROFEN 800 MG PO TABS: 800.0000 mg | ORAL_TABLET | Freq: Three times a day (TID) | ORAL | Status: DC

## 2014-05-13 NOTE — Discharge Instructions (Signed)
Health visitor Doctor, general practice Imaging) La Health visitor (RM) es un estudio de diagnstico por imgenes que produce imgenes digitales ntidas del interior del organismo sin usar rayosX. El resonador magntico [place] de radio y Engineer, manufacturing magntico para crear las imgenes. Las imgenes de Health visitor pueden Photographer en comparacin con las imgenes que se obtienen a Proofreader de las radiografas, las tomografas computarizadas o las ecografas. Se puede inyectar una sustancia de contraste para que las imgenes de la resonancia magntica sean incluso ms ntidas. En un resonador [employer], la zona del cuerpo en estudio estar dentro de la abertura central del aparato. En los resonadores abiertos, el aparato no rodea el cuerpo por completo.  INFORME A SU MDICO:  Si tiene cirugas previas.  Cualquier pieza de metal que tenga en el organismo. El imn utilizado en la resonancia magntica puede mover los objetos metlicos que hay en el cuerpo. Esto incluye:  Un marcapasos o cualquier otro implante de metal, como un neuroestimulador implantado, un implante metlico del odo o un objeto metlico dentro de la rbita del ojo.  Esquirlas metlicas en el cuerpo.  Cualquier fragmento de bala.  Un puerto para la administracin de insulina o quimioterapia.  Cualquier tatuaje. Algunas sustancias de contraste de color rojo contienen hierro, lo que a veces es un problema.  Si est embarazada o piensa que podra estarlo.  Si est amamantando.  Si le teme a los espacios cerrados (tiene claustrofobia). Si la claustrofobia es un problema, a menudo puede aliviarse con medicamentos o con el uso de un resonador magntico abierto.  Cualquier alergia que tenga.  Todos los [employer], incluidos vitaminas, hierbas, gotas oftlmicas, cremas y medicamentos de venta libre. RIESGOS Y COMPLICACIONES  En general, la resonancia magntica es un  procedimiento seguro. Sin embargo, pueden ocurrir algunos problemas que incluyen:  Si hay un implante de metal y no es Personal assistant, el potente campo magntico puede afectarlo. Adems, si el implante est cerca del lugar que se estudiar, puede ser difcil obtener imgenes de alta calidad.  Si est embarazada:  Generalmente, no debe realizarse Clinical biochemist trimestre de Media planner. No se conocen los efectos que la resonancia magntica puede tener en el feto. Durante este tiempo, es Multimedia programmer, a menos que se sospeche la presencia de una enfermedad grave que se estudia mejor con una resonancia magntica. Se debe considerar la posibilidad de Film/video editor resonancia magntica si existe un riesgo importante de que se omita el diagnstico correcto si no se realiza [employer].  Si est amamantando:  Debe informar al mdico y preguntarle cmo debe proceder. Puede extraerse [employer] con un sacaleche antes del estudio para [place], [place] tanto se haya eliminado la sustancia de [place], si se la ha [place], del [place]. ANTES DEL PROCEDIMIENTO   Se le pedir que se quite todos los objetos de metal, incluidos:  El reloj, las [place] y otros objetos de metal.  Algunos maquillajes tambin contienen restos de metal y tal vez tenga que quitrselos.  Los aparatos de ortodoncia y las emplomaduras no son un problema. PROCEDIMIENTO  Pueden darle auriculares o audfonos para que escuche msica. La resonancia magntica puede ser ruidosa.  Tal vez le inyecten una sustancia de [place].  La resonancia magntica estndar se realiza en una gran cmara magntica. Usted estar acostado en una plataforma que se desliza dentro de una cmara magntica. Una vez en el interior, podr seguir hablando con la persona que Personnel officer.  El resonador abierto tiene abierto por lo menos uno de los lados.  Le pedirn que se quede muy quieto. Le dirn cundo puede  cambiar de posicin. Tal vez deba esperar unos minutos hasta tanto se compruebe que las imgenes pueden leerse. DESPUS DEL PROCEDIMIENTO   Podr retomar sus actividades habituales de inmediato.  Si le administraron una sustancia de [place], el organismo la eliminar naturalmente en el trmino de Optician, dispensing.  Ardelia Mems persona con experiencia en Environmental manager (radilogo) [employer] y Audiological scientist un informe a su mdico junto con una explicacin de [place]. Document Released: 06/23/2005 Document Revised: 01/28/2014 [facility] Patient Information 2015 [place]. This information is not intended to replace advice given to you by your health care provider. Make sure you discuss any questions you have with your health care provider.

## 2014-05-13 NOTE — ED Provider Notes (Signed)
Medical screening examination/treatment/procedure(s) were conducted as a shared visit with non-physician practitioner(s) and myself.  I personally evaluated the patient during the encounter.   EKG Interpretation None       Doug SouSam Ryler Laskowski, MD 05/13/14 2326

## 2014-05-13 NOTE — ED Provider Notes (Signed)
Patient speaks little [place]. Family member uses interpreter. A professional interpreter was offered which patient declined. Patient complains of midline back pain for the past 2 weeks from midthoracic area the lumbar area. Pain is nonradiating not made better or worse by anything. No fever. No loss of bladder or bowel control. No abdominal pain. No respiratory complaint no urinary symptoms she treated herself with Tylenol, without relief. On exam no distress lungs clear to auscultation heart regular rate and rhythm abdomen nondistended nontender back without point tenderness or flank tenderness. Gait is normal motor strength 5 over 5 overall. MRI to be ordered as outpatient  Doug SouSam Ferlin [place], MD 05/13/14 1810

## 2014-05-13 NOTE — ED Notes (Signed)
Pt c/o lower back pain x 2 weeks 

## 2014-05-13 NOTE — ED Provider Notes (Signed)
CSN: [id number]     Arrival date & time 05/13/14  1701 History   First MD Initiated Contact with Patient 05/13/14 1711     Chief Complaint  Patient presents with  . Back Pain     (Consider location/radiation/quality/duration/timing/severity/associated sxs/prior Treatment) Patient is a 53 y.o. female presenting with back pain. The history is provided by the patient.  Back Pain Location:  Thoracic spine Associated symptoms: no abdominal pain, no chest pain, no dysuria and no fever   Associated symptoms comment:  She complains of a pain in her back from lower to mid-thoracic spine with a sharp, focal pain to the right mid para-thoracic back progressively worsening over the last 2 weeks. It is not made better or worse by movement. No cough, SOB or worsening pain with breathing. She denies abdominal pain, vomiting or fever. No flank pain or urinary symptoms. She is a diabetic and reports her CBG usually runs high, around 200 the last time she checked it. She reports she is compliant with her diabetes medications. She has a history of similar pain in the remote past for which she was prescribed Ultram.  She also states she has "sensitivity" of her skin from the point of pain to the right abdomen, but denies discoloration or rash.    Past Medical History  Diagnosis Date  . Diabetes mellitus    Past Surgical History  Procedure Laterality Date  . Carpel tunnel    . Cesarean section     History reviewed. No pertinent family history. History  Substance Use Topics  . Smoking status: Current Every Day Smoker  . Smokeless tobacco: Not on file  . Alcohol Use: No   OB History   Grav Para Term Preterm Abortions TAB SAB Ect Mult Living                 Review of Systems  Constitutional: Positive for chills. Negative for fever.  Respiratory: Negative.  Negative for cough and shortness of breath.   Cardiovascular: Negative.  Negative for chest pain.  Gastrointestinal: Negative.  Negative for  vomiting and abdominal pain.  Genitourinary: Negative for dysuria, frequency and flank pain.  Musculoskeletal: Positive for back pain.  Skin: Negative.  Negative for color change and rash.  Neurological: Negative.       Allergies  Review of patient's allergies indicates no known allergies.  Home Medications   Prior to Admission medications   Medication Sig Start Date End Date Taking? Authorizing Provider  atorvastatin (LIPITOR) 20 MG tablet Take 20 mg by mouth daily.    Historical Provider, MD  cephALEXin (KEFLEX) 500 MG capsule Take 1 capsule (500 mg total) by mouth 4 (four) times daily. 07/17/12   John L Molpus, MD  DULoxetine (CYMBALTA) 60 MG capsule Take 60 mg by mouth daily.    Historical Provider, MD  glipiZIDE (GLUCOTROL XL) 10 MG 24 hr tablet Take 10 mg by mouth daily.    Historical Provider, MD  HYDROcodone-acetaminophen (NORCO/VICODIN) 5-325 MG per tablet Take 1-2 tablets by mouth every 6 (six) hours as needed for pain. 07/17/12   Carlisle Beers Molpus, MD  HYDROcodone-acetaminophen (NORCO/VICODIN) 5-325 MG per tablet Take 1-2 tablets by mouth every 6 (six) hours as needed. 04/16/14   Carlisle Beers Molpus, MD  Insulin Aspart Prot & Aspart (NOVOLOG MIX 70/30 [place]) Inject into the skin.    Historical Provider, MD  levothyroxine (SYNTHROID, LEVOTHROID) 100 MCG tablet Take 100 mcg by mouth daily before breakfast.    Historical Provider, MD  metFORMIN (GLUCOPHAGE) 1000 MG tablet Take 1,000 mg by mouth 2 (two) times daily with a meal.    Historical Provider, MD  methocarbamol (ROBAXIN) 500 MG tablet Take 1 tablet (500 mg total) by mouth 2 (two) times daily. 04/17/14   Emilia BeckKaitlyn Szekalski, PA-C  oxyCODONE-acetaminophen (PERCOCET/ROXICET) 5-325 MG per tablet Take 1-2 tablets by mouth every 4 (four) hours as needed for moderate pain or severe pain. 04/17/14   Kaitlyn Szekalski, PA-C  pioglitazone (ACTOS) 45 MG tablet Take 45 mg by mouth daily.    Historical Provider, MD  sitaGLIPtin (JANUVIA) 50 MG tablet  Take 50 mg by mouth daily.    Historical Provider, MD  traZODone (DESYREL) 100 MG tablet Take 100 mg by mouth at bedtime.    Historical Provider, MD   BP 113/53  Pulse 115  Temp(Src) 98.9 F (37.2 C) (Oral)  Resp 20  SpO2 100% Physical Exam  Constitutional: She is oriented to person, place, and time. She appears well-developed and well-nourished.  HENT:  Head: Normocephalic.  Neck: Normal range of motion. Neck supple.  Cardiovascular: Normal rate and regular rhythm.   Pulmonary/Chest: Effort normal and breath sounds normal.  Abdominal: Soft. Bowel sounds are normal. There is no tenderness. There is no rebound and no guarding.  Genitourinary:  No CVA tenderness.   Musculoskeletal: Normal range of motion.  Tender right parathoracic spine without swelling or discoloration. FROM of back, able to sit up and lie down without worsening pain or limitation.   Neurological: She is alert and oriented to person, place, and time.  Skin: Skin is warm and dry. No rash noted.  Psychiatric: She has a normal mood and affect.    ED Course  Procedures (including critical care time) Labs Review Labs Reviewed - No data to display  Imaging Review No results found.   EKG Interpretation None      MDM   Final diagnoses:  None    1. Back pain  DDx: muscular pain vs radicular pain vs paraspinal abscess. Will arrange for outpatient MRI to evaluate for abscess and radicular origin of pain. Pain management provided.     Arnoldo HookerShari A Shivaay [place]ont, PA-C 05/13/14 1825

## 2014-05-22 ENCOUNTER — Ambulatory Visit (HOSPITAL_BASED_OUTPATIENT_CLINIC_OR_DEPARTMENT_OTHER)
Admission: RE | Admit: 2014-05-22 | Discharge: 2014-05-22 | Disposition: A | Discharge status: Home / Self Care | Payer: Medicaid Other | Source: Ambulatory Visit | Attending: Emergency Medicine | Admitting: Emergency Medicine

## 2014-05-22 DIAGNOSIS — M546 Pain in thoracic spine: Secondary | ICD-10-CM | POA: Insufficient documentation

## 2014-05-22 MED ORDER — GADOBENATE DIMEGLUMINE 529 MG/ML IV SOLN: 7.0000 mL | Freq: Once | INTRAVENOUS | Status: AC | PRN

## 2014-07-27 ENCOUNTER — Emergency Department (HOSPITAL_COMMUNITY): Payer: Medicaid Other

## 2014-07-27 ENCOUNTER — Encounter (HOSPITAL_BASED_OUTPATIENT_CLINIC_OR_DEPARTMENT_OTHER): Payer: Self-pay | Admitting: Emergency Medicine

## 2014-07-27 ENCOUNTER — Emergency Department (HOSPITAL_BASED_OUTPATIENT_CLINIC_OR_DEPARTMENT_OTHER)
Admission: EM | Admit: 2014-07-27 | Discharge: 2014-07-28 | Disposition: A | Discharge status: Home / Self Care | Payer: Medicaid Other | Attending: Emergency Medicine | Admitting: Emergency Medicine

## 2014-07-27 ENCOUNTER — Emergency Department (HOSPITAL_BASED_OUTPATIENT_CLINIC_OR_DEPARTMENT_OTHER): Payer: Medicaid Other

## 2014-07-27 DIAGNOSIS — R519 Headache, unspecified: Secondary | ICD-10-CM

## 2014-07-27 DIAGNOSIS — R42 Dizziness and giddiness: Secondary | ICD-10-CM

## 2014-07-27 DIAGNOSIS — F17210 Nicotine dependence, cigarettes, uncomplicated: Secondary | ICD-10-CM | POA: Diagnosis not present

## 2014-07-27 DIAGNOSIS — R51 Headache: Secondary | ICD-10-CM | POA: Diagnosis not present

## 2014-07-27 DIAGNOSIS — E119 Type 2 diabetes mellitus without complications: Secondary | ICD-10-CM | POA: Diagnosis not present

## 2014-07-27 LAB — CBC WITH DIFFERENTIAL/PLATELET
Basophils Absolute: 0.1 10*3/uL (ref 0.0–0.1)
Basophils Relative: 1 % (ref 0–1)
EOS PCT: 2 % (ref 0–5)
Eosinophils Absolute: 0.2 10*3/uL (ref 0.0–0.7)
HCT: 32.1 % — ABNORMAL LOW (ref 36.0–46.0)
HEMOGLOBIN: 11 g/dL — AB (ref 12.0–15.0)
LYMPHS ABS: 2.2 10*3/uL (ref 0.7–4.0)
Lymphocytes Relative: 29 % (ref 12–46)
MCH: 30.6 pg (ref 26.0–34.0)
MCHC: 34.3 g/dL (ref 30.0–36.0)
MCV: 89.4 fL (ref 78.0–100.0)
MONO ABS: 0.6 10*3/uL (ref 0.1–1.0)
MONOS PCT: 8 % (ref 3–12)
Neutro Abs: 4.7 10*3/uL (ref 1.7–7.7)
Neutrophils Relative %: 60 % (ref 43–77)
Platelets: 219 10*3/uL (ref 150–400)
RBC: 3.59 MIL/uL — AB (ref 3.87–5.11)
RDW: 12 % (ref 11.5–15.5)
WBC: 7.6 10*3/uL (ref 4.0–10.5)

## 2014-07-27 LAB — URINALYSIS, ROUTINE W REFLEX MICROSCOPIC
BILIRUBIN URINE: NEGATIVE
GLUCOSE, UA: NEGATIVE mg/dL
HGB URINE DIPSTICK: NEGATIVE
KETONES UR: NEGATIVE mg/dL
Leukocytes, UA: NEGATIVE
Nitrite: NEGATIVE
PROTEIN: NEGATIVE mg/dL
Specific Gravity, Urine: 1.009 (ref 1.005–1.030)
Urobilinogen, UA: 0.2 mg/dL (ref 0.0–1.0)
pH: 5.5 (ref 5.0–8.0)

## 2014-07-27 LAB — BASIC METABOLIC PANEL
Anion gap: 14 (ref 5–15)
BUN: 11 mg/dL (ref 6–23)
CALCIUM: 9.4 mg/dL (ref 8.4–10.5)
CO2: 24 mEq/L (ref 19–32)
CREATININE: 0.7 mg/dL (ref 0.50–1.10)
Chloride: 101 mEq/L (ref 96–112)
GFR calc Af Amer: >90 mL/min (ref >90)
GLUCOSE: 164 mg/dL — AB (ref 70–99)
Potassium: 4.3 mEq/L (ref 3.7–5.3)
Sodium: 139 mEq/L (ref 137–147)

## 2014-07-27 MED ORDER — MECLIZINE HCL 25 MG PO TABS
25.0000 mg | ORAL_TABLET | Freq: Once | ORAL | Status: AC
  Administered 2014-07-27: 25 mg via ORAL
  Filled 2014-07-27: qty 1

## 2014-07-27 MED ORDER — SODIUM CHLORIDE 0.9 % IV BOLUS (SEPSIS)
1000.0000 mL | Freq: Once | INTRAVENOUS | Status: AC
  Administered 2014-07-27: 1000 mL via INTRAVENOUS

## 2014-07-27 MED ORDER — METOCLOPRAMIDE HCL 5 MG/ML IJ SOLN
10.0000 mg | Freq: Once | INTRAMUSCULAR | Status: AC
  Administered 2014-07-27: 10 mg via INTRAVENOUS
  Filled 2014-07-27: qty 2

## 2014-07-27 MED ORDER — KETOROLAC TROMETHAMINE 30 MG/ML IJ SOLN
30.0000 mg | Freq: Once | INTRAMUSCULAR | Status: AC
  Administered 2014-07-27: 30 mg via INTRAVENOUS
  Filled 2014-07-27: qty 1

## 2014-07-27 MED ORDER — DIAZEPAM 5 MG/ML IJ SOLN
2.5000 mg | Freq: Once | INTRAMUSCULAR | Status: AC
  Administered 2014-07-27: 2.5 mg via INTRAVENOUS
  Filled 2014-07-27: qty 2

## 2014-07-27 MED ORDER — DIPHENHYDRAMINE HCL 50 MG/ML IJ SOLN
25.0000 mg | Freq: Once | INTRAMUSCULAR | Status: AC
  Administered 2014-07-27: 25 mg via INTRAVENOUS
  Filled 2014-07-27: qty 1

## 2014-07-27 NOTE — ED Notes (Signed)
Pt placed on monitor upon arrival to room. Pt monitored by blood pressure, pulse ox, and 5 lead.  

## 2014-07-27 NOTE — ED Provider Notes (Signed)
CSN: [id number]     Arrival date & time 07/27/14  1348 History   First MD Initiated Contact with Patient 07/27/14 1348     Chief Complaint  Patient presents with  . Dizziness  . Headache     (Consider location/radiation/quality/duration/timing/severity/associated sxs/prior Treatment) Patient is a 53 y.o. female presenting with headaches. The history is provided by the patient. No language interpreter was used.  Headache Pain location:  L temporal and R temporal Quality:  Sharp Radiates to:  Does not radiate Onset quality:  Gradual Duration:  2 days Timing:  Constant Progression:  Worsening Chronicity:  New Relieved by:  Nothing Worsened by:  Light Ineffective treatments:  None tried Associated symptoms: dizziness, nausea and photophobia   Associated symptoms: no abdominal pain, no congestion, no cough, no diarrhea, no fever, no numbness, no paresthesias, no sore throat and no vomiting   Risk factors: no anger and no family hx of SAH   Risk factors comment:  No history of connective tissue disease, no family or personal histyr of renal aneurysm.    Past Medical History  Diagnosis Date  . Diabetes mellitus    Past Surgical History  Procedure Laterality Date  . Carpel tunnel    . Cesarean section     No family history on file. History  Substance Use Topics  . Smoking status: Current Every Day Smoker    Types: Cigarettes  . Smokeless tobacco: Not on file  . Alcohol Use: No   OB History   Grav Para Term Preterm Abortions TAB SAB Ect Mult Living                 Review of Systems  Constitutional: Negative for fever.  HENT: Negative for congestion, rhinorrhea and sore throat.   Eyes: Positive for photophobia.  Respiratory: Negative for cough and shortness of breath.   Cardiovascular: Negative for chest pain.  Gastrointestinal: Positive for nausea. Negative for vomiting, abdominal pain and diarrhea.  Genitourinary: Negative for dysuria and hematuria.  Skin:  Negative for rash.  Neurological: Positive for dizziness and headaches. Negative for syncope, light-headedness, numbness and paresthesias.  All other systems reviewed and are negative.     Allergies  Review of patient's allergies indicates no known allergies.  Home Medications   Prior to Admission medications   Medication Sig Start Date End Date Taking? Authorizing Provider  glipiZIDE (GLUCOTROL XL) 10 MG 24 hr tablet Take 10 mg by mouth daily.   Yes Historical Provider, MD  levothyroxine (SYNTHROID, LEVOTHROID) 100 MCG tablet Take 100 mcg by mouth daily before breakfast.   Yes Historical Provider, MD  atorvastatin (LIPITOR) 20 MG tablet Take 20 mg by mouth daily.    Historical Provider, MD  cephALEXin (KEFLEX) 500 MG capsule Take 1 capsule (500 mg total) by mouth 4 (four) times daily. 07/17/12   John L Molpus, MD  DULoxetine (CYMBALTA) 60 MG capsule Take 60 mg by mouth daily.    Historical Provider, MD  HYDROcodone-acetaminophen (NORCO/VICODIN) 5-325 MG per tablet Take 1-2 tablets by mouth every 6 (six) hours as needed for pain. 07/17/12   Carlisle BeersJohn L Molpus, MD  HYDROcodone-acetaminophen (NORCO/VICODIN) 5-325 MG per tablet Take 1-2 tablets by mouth every 6 (six) hours as needed. 04/16/14   Carlisle BeersJohn L Molpus, MD  HYDROcodone-acetaminophen (NORCO/VICODIN) 5-325 MG per tablet Take 1-2 tablets by mouth every 4 (four) hours as needed. 05/13/14   Shari A Upstill, PA-C  ibuprofen (ADVIL,MOTRIN) 800 MG tablet Take 1 tablet (800 mg total) by mouth 3 (  three) times daily. 05/13/14   Shari A Upstill, PA-C  Insulin Aspart Prot & Aspart (NOVOLOG MIX 70/30 [place]) Inject into the skin.    Historical Provider, MD  metFORMIN (GLUCOPHAGE) 1000 MG tablet Take 1,000 mg by mouth 2 (two) times daily with a meal.    Historical Provider, MD  methocarbamol (ROBAXIN) 500 MG tablet Take 1 tablet (500 mg total) by mouth 2 (two) times daily. 04/17/14   Emilia Beck, PA-C  oxyCODONE-acetaminophen (PERCOCET/ROXICET) 5-325 MG per  tablet Take 1-2 tablets by mouth every 4 (four) hours as needed for moderate pain or severe pain. 04/17/14   Kaitlyn Szekalski, PA-C  pioglitazone (ACTOS) 45 MG tablet Take 45 mg by mouth daily.    Historical Provider, MD  sitaGLIPtin (JANUVIA) 50 MG tablet Take 50 mg by mouth daily.    Historical Provider, MD  traZODone (DESYREL) 100 MG tablet Take 100 mg by mouth at bedtime.    Historical Provider, MD   BP 104/43  Pulse 94  Temp(Src) 98.4 F (36.9 C) (Oral)  Resp 18  Ht 5\' 6"  (1.676 m)  Wt 145 lb (65.772 kg)  BMI 23.41 kg/m2  SpO2 100% Physical Exam  Nursing note and vitals reviewed. Constitutional: She is oriented to person, place, and time. She appears well-developed and well-nourished.  HENT:  Head: Normocephalic and atraumatic.  Right Ear: External ear normal.  Left Ear: External ear normal.  Eyes: EOM are normal.  Neck: Normal range of motion. Neck supple.  Cardiovascular: Normal rate, regular rhythm and intact distal pulses.  Exam reveals no gallop and no friction rub.   No murmur heard. Pulmonary/Chest: Effort normal and breath sounds normal. No respiratory distress. She has no wheezes. She has no rales. She exhibits no tenderness.  Abdominal: Soft. Bowel sounds are normal. She exhibits no distension. There is no tenderness. There is no rebound.  Musculoskeletal: Normal range of motion. She exhibits no edema and no tenderness.  Lymphadenopathy:    She has no cervical adenopathy.  Neurological: She is alert and oriented to person, place, and time. No cranial nerve deficit. Coordination normal.  Neurologic exam: Fundoscopic exam: no papilledema, no hemorrhages, CN I-XII: grossly intact, Sensation: normal in upper and lower extremities, Strength 5/5 in both upper and lower extremities, Coordination intact. Gait deferred secondary to symptomatic dizziness  Skin: Skin is warm. No rash noted.  Psychiatric: She has a normal mood and affect. Her behavior is normal.    ED Course   Procedures (including critical care time) Labs Review Labs Reviewed  CBC WITH DIFFERENTIAL - Abnormal; Notable for the following:    RBC 3.59 (*)    Hemoglobin 11.0 (*)    HCT 32.1 (*)    All other components within normal limits  BASIC METABOLIC PANEL - Abnormal; Notable for the following:    Glucose, Bld 164 (*)    All other components within normal limits  URINALYSIS, ROUTINE W REFLEX MICROSCOPIC    Imaging Review Ct Head Wo Contrast  07/27/2014   CLINICAL DATA:  Headache, dizziness, nausea and weakness.  EXAM: CT HEAD WITHOUT CONTRAST  TECHNIQUE: Contiguous axial images were obtained from the base of the skull through the vertex without intravenous contrast.  COMPARISON:  None.  FINDINGS: The brain demonstrates no evidence of hemorrhage, infarction, edema, mass effect, extra-axial fluid collection, hydrocephalus or mass lesion. The skull is unremarkable.  IMPRESSION: Normal head CT.   Electronically Signed   By: Irish Lack M.D.   On: 07/27/2014 14:31  EKG Interpretation None      MDM   Final diagnoses:  Headache  Dizziness    7:26 PM Pt is a 53 y.o. female with pertinent PMHX of DM, HLD who presents to the ED with headache and dizziness. Endorses nausea along with headache. Awoke yesterday with dizziness. Worse with moving head rapidly. Endorses room spinning. Worse today. No fever. No recent recent illness.  Endorses headache entire head, pain around temples. No thunderclap. Feels similar to previous headaches. No family history of renal aneurysm, denies any connective tissue tissue disorders. Endorses photophobia. No hearing or vision loss to suggest central pathology. Plan for migraine cocktail, meclazine and MRI brain wo contrast  10:30PM: patient headache imrpoved, however stood up to go to the bathroom and had worsening dizziness.  Patient care transferred to Dr. Juleen ChinaKohut pending MRI to rule out posterior stroke given patient continues to be  symptomatic  Imaging reviewed by myself and considered in medical decision making if ordered.  Imaging interpreted by radiology. Pt was discussed with my attending, Dr. Lowella PettiesWofford     Kaven Cumbie Peter Ruthann Angulo, MD 07/28/14 [phone]

## 2014-07-27 NOTE — ED Notes (Signed)
Pt placed on monitor upon arrival to room from restroom. Pt was able to ambulate to the restroom with stand by assistance. Pt continues to be monitored by blood pressure, pulse ox, and 5 lead. Pts family remains at bedside.

## 2014-07-27 NOTE — ED Notes (Signed)
Patient states that for the last 2 -3 days she has had dizziness and nausea. The patient reports that the yesterday she started to have a Headache, and it worsens with the turning her head. The patient also reports that she gets more dizzy while standing.

## 2014-07-27 NOTE — ED Notes (Addendum)
Pt from [facility]. Pt reports feeling dizzy yesterday when moving head rapidly and standing up accompanied by h/a.  Pt reports h/a was worse today when she woke up, especially in her temples.  Pt reports nausea. Pt A&O.

## 2014-07-27 NOTE — ED Notes (Addendum)
[facility]  Patient with headache with turning of head, which gets worse and dizziness gets worse with the turning.  She does have photophobia.  She was given Toradol, 2.5mg  Valium IV and one liter of saline.  Patient is spanish speaking only.  Patient states the pain is coming back at this time.  It was a zero when leaving [facility].  Patient is her for MRI.

## 2014-07-27 NOTE — ED Provider Notes (Signed)
CSN: [id number]     Arrival date & time 07/27/14  1348 History   First MD Initiated Contact with Patient 07/27/14 1348     Chief Complaint  Patient presents with  . Dizziness     (Consider location/radiation/quality/duration/timing/severity/associated sxs/prior Treatment) HPI Comments: Patient is a 53 year old female with a past medical history of diabetes, hyperlipidemia, and tobacco use who presents with dizziness that started yesterday morning when she woke up. Patient reports getting out of bed when she had sudden onset of room spinning dizziness that has continued. Patient reports being worried when she developed a severe headache this morning that started gradually. The headache is located in the front of her head and does not radiate. Patient reports associated nausea with the headache and dizziness. Moving her head makes the dizziness and pain worse but sitting still does not make her symptoms subside. Patient did not try anything for symptom relief. No alleviating factors.    Past Medical History  Diagnosis Date  . Diabetes mellitus    Past Surgical History  Procedure Laterality Date  . Carpel tunnel    . Cesarean section     No family history on file. History  Substance Use Topics  . Smoking status: Current Every Day Smoker    Types: Cigarettes  . Smokeless tobacco: Not on file  . Alcohol Use: No   OB History   Grav Para Term Preterm Abortions TAB SAB Ect Mult Living                 Review of Systems  Constitutional: Negative for fever, chills and fatigue.  HENT: Negative for trouble swallowing.   Eyes: Negative for visual disturbance.  Respiratory: Negative for shortness of breath.   Cardiovascular: Negative for chest pain and palpitations.  Gastrointestinal: Positive for nausea. Negative for vomiting, abdominal pain and diarrhea.  Genitourinary: Negative for dysuria and difficulty urinating.  Musculoskeletal: Negative for arthralgias and neck pain.  Skin:  Negative for color change.  Neurological: Positive for dizziness and headaches. Negative for weakness.  Psychiatric/Behavioral: Negative for dysphoric mood.      Allergies  Review of patient's allergies indicates no known allergies.  Home Medications   Prior to Admission medications   Medication Sig Start Date End Date Taking? Authorizing Provider  glipiZIDE (GLUCOTROL XL) 10 MG 24 hr tablet Take 10 mg by mouth daily.   Yes Historical Provider, MD  levothyroxine (SYNTHROID, LEVOTHROID) 100 MCG tablet Take 100 mcg by mouth daily before breakfast.   Yes Historical Provider, MD  atorvastatin (LIPITOR) 20 MG tablet Take 20 mg by mouth daily.    Historical Provider, MD  cephALEXin (KEFLEX) 500 MG capsule Take 1 capsule (500 mg total) by mouth 4 (four) times daily. 07/17/12   John L Molpus, MD  DULoxetine (CYMBALTA) 60 MG capsule Take 60 mg by mouth daily.    Historical Provider, MD  HYDROcodone-acetaminophen (NORCO/VICODIN) 5-325 MG per tablet Take 1-2 tablets by mouth every 6 (six) hours as needed for pain. 07/17/12   Carlisle BeersJohn L Molpus, MD  HYDROcodone-acetaminophen (NORCO/VICODIN) 5-325 MG per tablet Take 1-2 tablets by mouth every 6 (six) hours as needed. 04/16/14   Carlisle BeersJohn L Molpus, MD  HYDROcodone-acetaminophen (NORCO/VICODIN) 5-325 MG per tablet Take 1-2 tablets by mouth every 4 (four) hours as needed. 05/13/14   Shari A Upstill, PA-C  ibuprofen (ADVIL,MOTRIN) 800 MG tablet Take 1 tablet (800 mg total) by mouth 3 (three) times daily. 05/13/14   Shari A Upstill, PA-C  Insulin Aspart  Prot & Aspart (NOVOLOG MIX 70/30 [place]) Inject into the skin.    Historical Provider, MD  metFORMIN (GLUCOPHAGE) 1000 MG tablet Take 1,000 mg by mouth 2 (two) times daily with a meal.    Historical Provider, MD  methocarbamol (ROBAXIN) 500 MG tablet Take 1 tablet (500 mg total) by mouth 2 (two) times daily. 04/17/14   Emilia BeckKaitlyn Blong Busk, PA-C  oxyCODONE-acetaminophen (PERCOCET/ROXICET) 5-325 MG per tablet Take 1-2 tablets by  mouth every 4 (four) hours as needed for moderate pain or severe pain. 04/17/14   Janazia Schreier, PA-C  pioglitazone (ACTOS) 45 MG tablet Take 45 mg by mouth daily.    Historical Provider, MD  sitaGLIPtin (JANUVIA) 50 MG tablet Take 50 mg by mouth daily.    Historical Provider, MD  traZODone (DESYREL) 100 MG tablet Take 100 mg by mouth at bedtime.    Historical Provider, MD   BP 100/58  Pulse 100  Temp(Src) 98.4 F (36.9 C) (Oral)  Resp 16  Ht 5\' 6"  (1.676 m)  Wt 145 lb (65.772 kg)  BMI 23.41 kg/m2  SpO2 100% Physical Exam  Nursing note and vitals reviewed. Constitutional: She is oriented to person, place, and time. She appears well-developed and well-nourished. No distress.  HENT:  Head: Normocephalic and atraumatic.  Eyes: Conjunctivae are normal.  Neck: Normal range of motion.  Cardiovascular: Normal rate and regular rhythm.  Exam reveals no gallop and no friction rub.   No murmur heard. Pulmonary/Chest: Effort normal and breath sounds normal. She has no wheezes. She has no rales. She exhibits no tenderness.  Abdominal: Soft. She exhibits no distension. There is no tenderness. There is no rebound and no guarding.  Musculoskeletal: Normal range of motion.  Neurological: She is alert and oriented to person, place, and time. No cranial nerve deficit. Coordination normal.  Extremity strength and sensation equal and intact bilaterally. Speech is goal-oriented. Moves limbs without ataxia.   Skin: Skin is warm and dry.  Psychiatric: She has a normal mood and affect. Her behavior is normal.    ED Course  Procedures (including critical care time) Labs Review Labs Reviewed  CBC WITH DIFFERENTIAL - Abnormal; Notable for the following:    RBC 3.59 (*)    Hemoglobin 11.0 (*)    HCT 32.1 (*)    All other components within normal limits  BASIC METABOLIC PANEL - Abnormal; Notable for the following:    Glucose, Bld 164 (*)    All other components within normal limits  URINALYSIS,  ROUTINE W REFLEX MICROSCOPIC    Imaging Review Ct Head Wo Contrast  07/27/2014   CLINICAL DATA:  Headache, dizziness, nausea and weakness.  EXAM: CT HEAD WITHOUT CONTRAST  TECHNIQUE: Contiguous axial images were obtained from the base of the skull through the vertex without intravenous contrast.  COMPARISON:  None.  FINDINGS: The brain demonstrates no evidence of hemorrhage, infarction, edema, mass effect, extra-axial fluid collection, hydrocephalus or mass lesion. The skull is unremarkable.  IMPRESSION: Normal head CT.   Electronically Signed   By: Irish LackGlenn  Yamagata M.D.   On: 07/27/2014 14:31     EKG Interpretation None      MDM   Final diagnoses:  Headache  Dizziness    2:25 PM CT head and labs pending. Patient has no neuro deficits at this time. Vitals stable and patient afebrile.   6:01 PM Patient's head CT and labs unremarkable for acute changes. Patient reports no relief with IV valium, fluids, and toradol. I spoke with Dr. Hosie PoissonSumner,  Neurohospitalist on call, who recommends transferring her to the ED at [facility] for MRI to rule out posterior circulation stroke. I spoke with Dr. Loretha Stapler who will accept the patient and is aware she will be coming.   Emilia Beck, [place] 07/27/14 [phone]

## 2014-07-27 NOTE — ED Provider Notes (Signed)
I saw and evaluated the patient, reviewed the resident's note and I agree with the findings and plan.   EKG Interpretation None      53 yo female presenting as a transfer from [facility] with dizziness and headache.  Sent from [facility] to rule out posterior circulation stroke.  On exam, well appearing, nontoxic, not distressed, normal respiratory effort, normal perfusion, EOMI without nystagmus, normal finger to nose.  Pt states symptoms resolved when laying still, but return when she gets up or moves.  MR to rule out central vertigo given persistence of symptoms despite multiple treatments.    Care transferred at 12:00AM with MR pending.   Clinical Impression: 1. Headache   2. Dizziness       Warnell Foresterrey Lateefa Crosby, MD 07/28/14 [id number]

## 2014-07-27 NOTE — ED Notes (Signed)
MRI called sts closed after 7pm on weekends but is here for emergency MRI and will scan patient as well. Sts approximate wait time 2 hours. Modesto CharonWong MD and Patient made aware. Pt given food and drinks after passing swallow screen per Dr. Modesto CharonWong.

## 2014-07-28 ENCOUNTER — Ambulatory Visit (HOSPITAL_COMMUNITY)
Admission: RE | Admit: 2014-07-28 | Discharge: 2014-07-28 | Disposition: A | Discharge status: Home / Self Care | Payer: Medicaid Other | Source: Ambulatory Visit | Attending: Emergency Medicine | Admitting: Emergency Medicine

## 2014-07-28 ENCOUNTER — Other Ambulatory Visit: Payer: Self-pay | Admitting: Emergency Medicine

## 2014-07-28 ENCOUNTER — Other Ambulatory Visit (HOSPITAL_COMMUNITY): Payer: Self-pay | Admitting: Emergency Medicine

## 2014-07-28 DIAGNOSIS — R42 Dizziness and giddiness: Secondary | ICD-10-CM

## 2014-07-28 DIAGNOSIS — E119 Type 2 diabetes mellitus without complications: Secondary | ICD-10-CM | POA: Insufficient documentation

## 2016-05-13 ENCOUNTER — Other Ambulatory Visit: Payer: Self-pay | Admitting: Ophthalmology

## 2016-05-13 DIAGNOSIS — H401234 Low-tension glaucoma, bilateral, indeterminate stage: Secondary | ICD-10-CM

## 2016-05-23 ENCOUNTER — Other Ambulatory Visit: Payer: Medicaid Other

## 2016-06-03 ENCOUNTER — Other Ambulatory Visit: Payer: Self-pay | Admitting: Ophthalmology

## 2016-06-03 ENCOUNTER — Inpatient Hospital Stay: Admission: RE | Admit: 2016-06-03 | Payer: Medicaid Other | Source: Ambulatory Visit

## 2016-06-03 ENCOUNTER — Ambulatory Visit
Admission: RE | Admit: 2016-06-03 | Discharge: 2016-06-03 | Disposition: A | Discharge status: Home / Self Care | Payer: Medicaid Other | Source: Ambulatory Visit | Attending: Ophthalmology | Admitting: Ophthalmology

## 2016-06-03 DIAGNOSIS — H401234 Low-tension glaucoma, bilateral, indeterminate stage: Secondary | ICD-10-CM

## 2016-06-03 MED ORDER — GADOBENATE DIMEGLUMINE 529 MG/ML IV SOLN: 13.0000 mL | Freq: Once | INTRAVENOUS | Status: DC | PRN

## 2018-05-14 IMAGING — MR MR ORBITS W/O CM
9 of 11 series · 31 of 48 positions shown · non-contrast
Comparison: Previous MRI from 07/28/2014.

CLINICAL DATA: Initial evaluation for bilateral low tension
glaucoma. Prior cataract surgery.

EXAM:
MRI HEAD AND ORBITS WITHOUT CONTRAST
TECHNIQUE: Multiplanar, multiecho pulse sequences of the brain and surrounding
structures were obtained without intravenous contrast. Multiplanar,
multiecho pulse sequences of the orbits and surrounding structures
were obtained including fat saturation techniques, without
intravenous contrast administration.

[Series 2: T1 · sagittal · 5.0mm · 0.45mm/px · 1 of 19 slices shown]
[im 1/19]
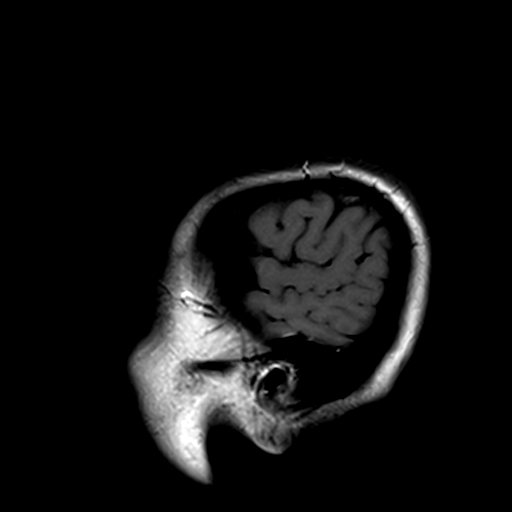

[Series 3: t2_tse_tra · axial · 5.0mm · 0.45mm/px · z∈[-62,+74]mm · 3 of 22 slices shown]
[im 1/22]
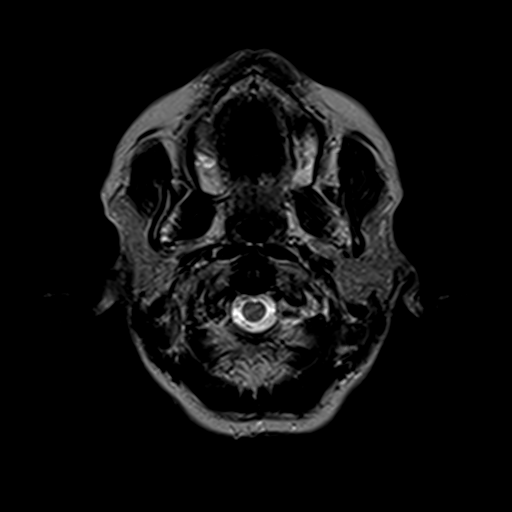
[im 11/22]
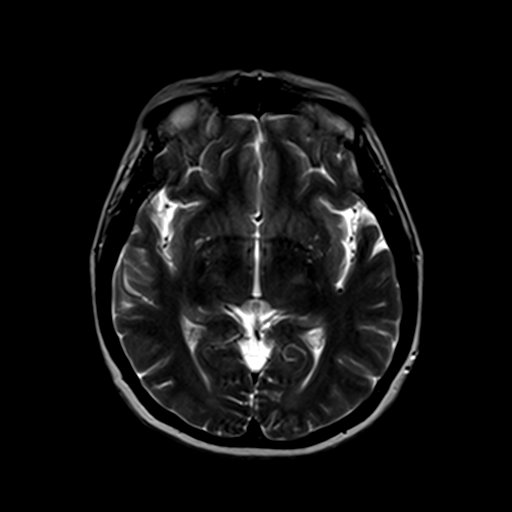
[im 22/22]
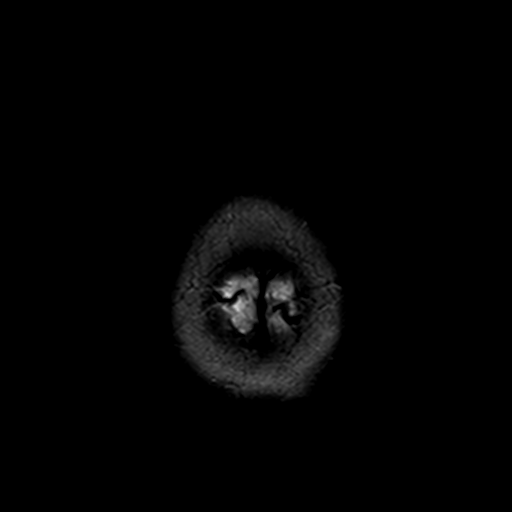

[Series 4: ep2d_diff_(id)_trace · axial · 3.0mm · 1.80mm/px · z∈[-64,+70]mm · 8 of 92 slices shown]
[im 1/92]
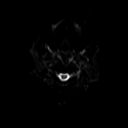
[im 19/92]
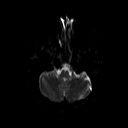
[im 28/92]
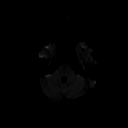
[im 37/92]
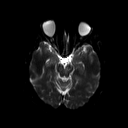
[im 55/92]
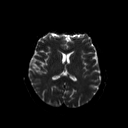
[im 64/92]
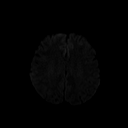
[im 73/92]
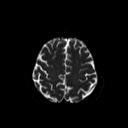
[im 92/92]
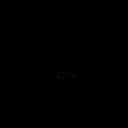

[Series 5: ep2d_diff_(id)_trace_adc · axial · 3.0mm · 1.80mm/px · z∈[-64,+70]mm · 5 of 44 slices shown]
[im 1/44]
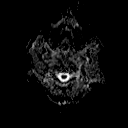
[im 11/44]
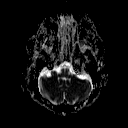
[im 22/44]
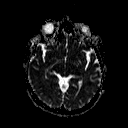
[im 33/44]
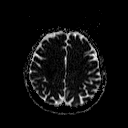
[im 44/44]
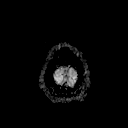

[Series 6: FLAIR · axial · 5.0mm · 0.45mm/px · z∈[-63,+72]mm · 3 of 22 slices shown]
[im 1/22]
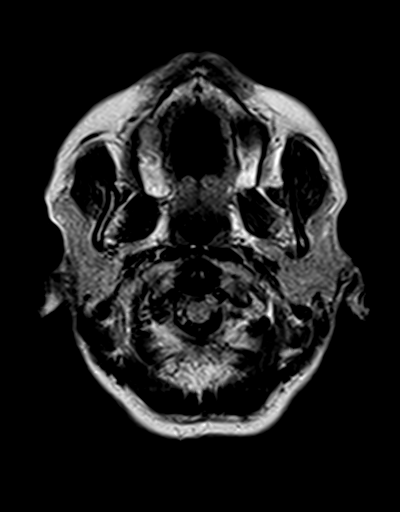
[im 11/22]
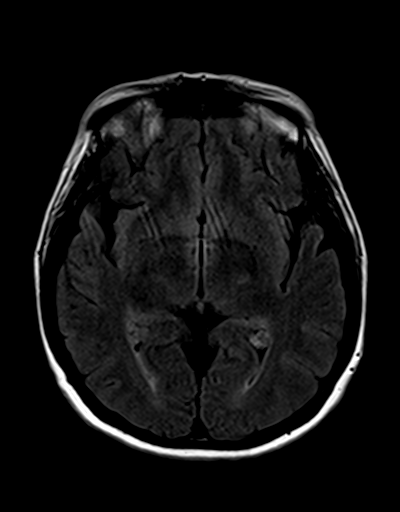
[im 22/22]
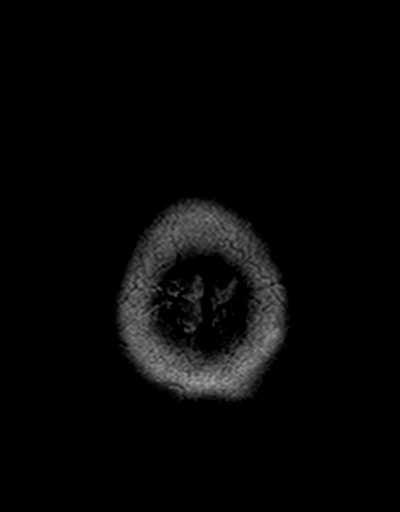

[Series 7: t1_tse cor_3mm · coronal · 3.0mm · 0.35mm/px · 3 of 22 slices shown]
[im 1/22]
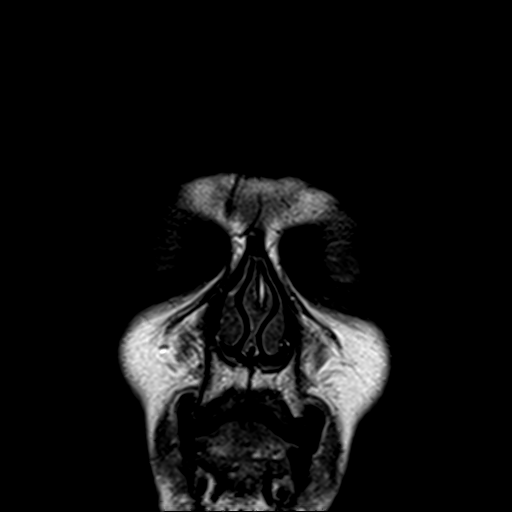
[im 11/22]
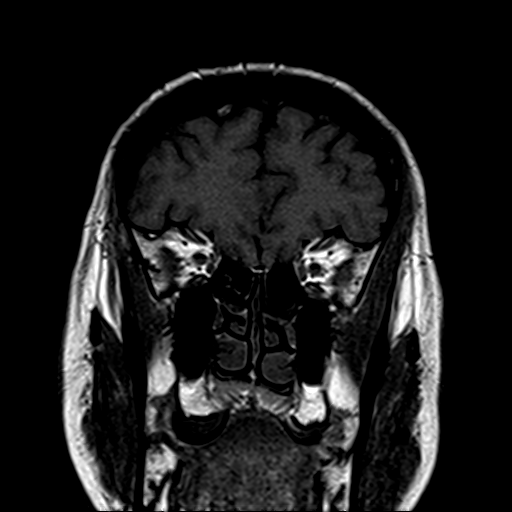
[im 22/22]
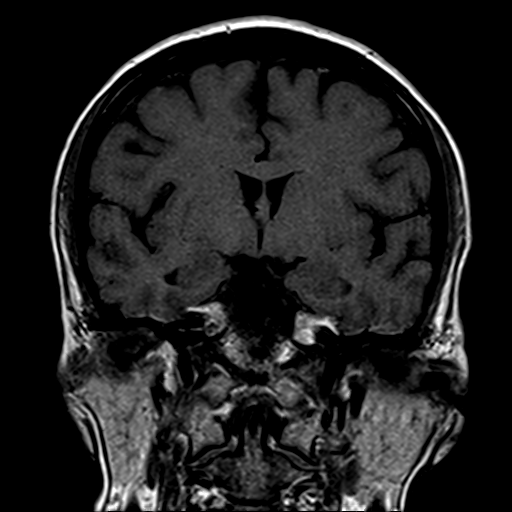

[Series 8: t2_cor_fs_3mm · coronal · 3.0mm · 0.35mm/px · 3 of 22 slices shown]
[im 1/22]
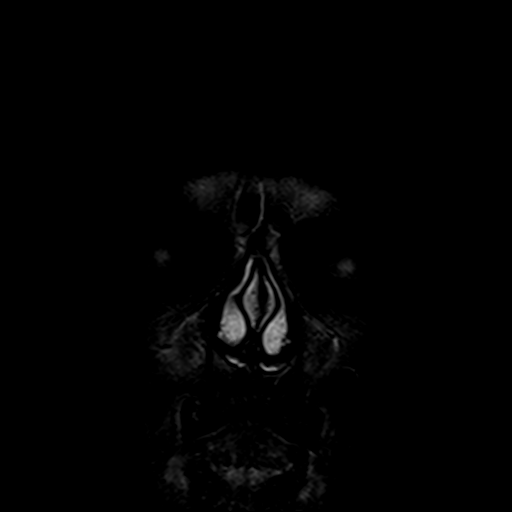
[im 11/22]
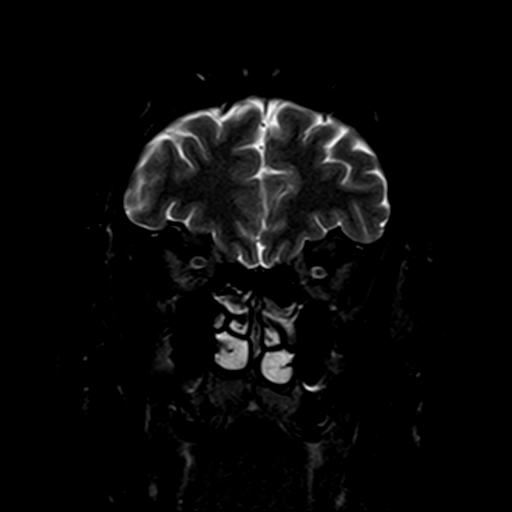
[im 22/22]
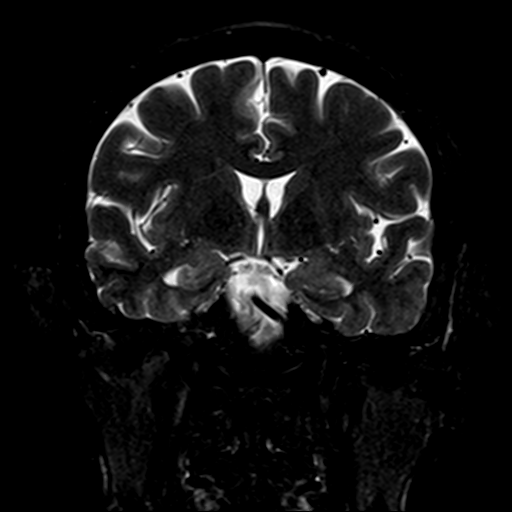

[Series 9: GRE · axial · 5.0mm · 0.45mm/px · z∈[-63,+72]mm · 3 of 22 slices shown]
[im 1/22]
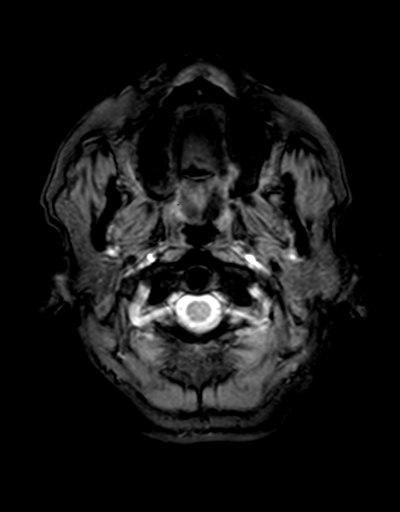
[im 11/22]
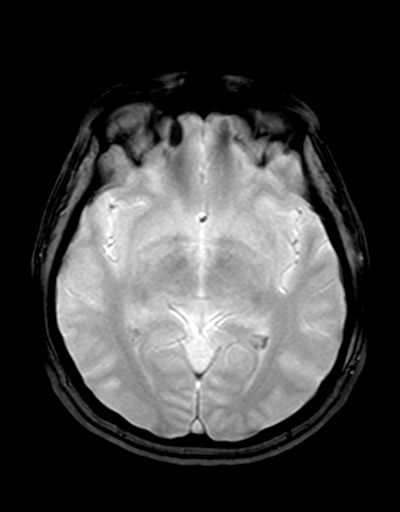
[im 22/22]
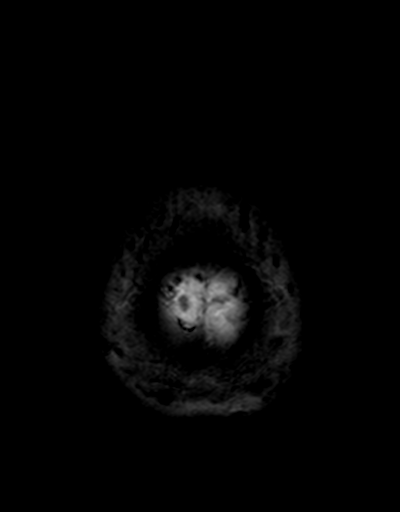

[Series 10: t1_tse axial_3mm · axial · 3.0mm · 0.35mm/px · z∈[-66,-23]mm · 2 of 25 slices shown]
[im 1/25]
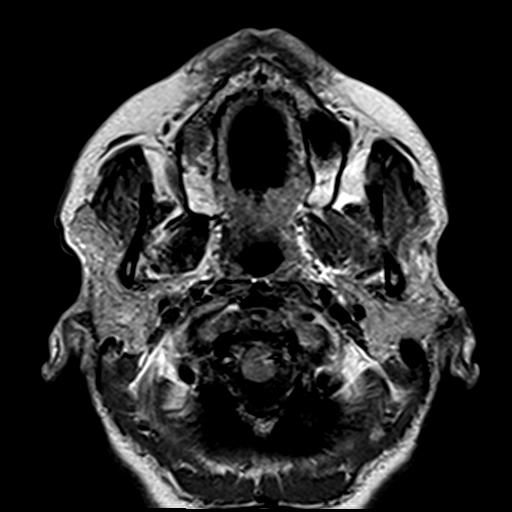
[im 13/25]
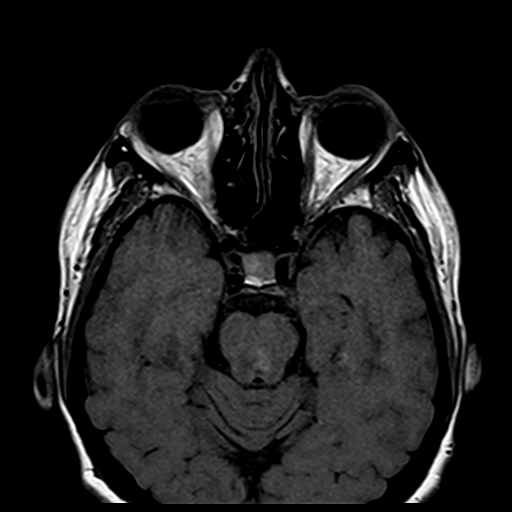

[31 of 48 positions shown; findings below may reference images not displayed]

FINDINGS: MRI HEAD FINDINGS

Please note that study is mildly limited as the patient herpes IV
gadolinium. Additionally, patient was unable to tolerate the full
length of the exam due to claustrophobia.

Cerebral volume within normal limits for patient age. No focal
parenchymal signal abnormality identified. No significant cerebral
white matter disease for patient age.

No abnormal foci of restricted diffusion to suggest acute R subacute
ischemia. Gray-white matter differentiation well maintained. Major
intracranial vascular flow voids are well preserved. No acute or
chronic intracranial hemorrhage. No areas of chronic infarction.

No mass lesion, midline shift, or mass effect. No hydrocephalus. No
extra-axial fluid collection. Major dural sinuses are grossly
patent.

Craniocervical junction normal. Visualized upper cervical spine
unremarkable.

Pituitary gland within normal limits.

Paranasal sinuses are clear. No mastoid effusion. Inner ear
structures grossly normal.

Bone marrow signal intensity within normal limits. No scalp soft
tissue abnormality.

MRI ORBITS FINDINGS

Dedicated views of the orbits were performed. Globes are normal and
size and appearance bilaterally with normal contour. Sequela of
prior bilateral cataract extraction noted. Optic nerves are
symmetric and normal in appearance bilaterally. Symmetric fluid
about the optic nerve sheaths. Extraocular muscles are symmetric in
size and normal in appearance bilaterally. Superior ophthalmic veins
are symmetric and within normal limits. Lacrimal glands normal. No
abnormality seen at the orbital apex bilaterally. Cavernous sinus
within normal limits. Optic chiasm normally situated within the
suprasellar cistern. Periorbital preseptal soft tissues are normal.
IMPRESSION: 1. Normal MRI of the brain and orbits.
2. Sequela of prior bilateral cataract extraction.

## 2020-05-10 ENCOUNTER — Encounter (HOSPITAL_BASED_OUTPATIENT_CLINIC_OR_DEPARTMENT_OTHER): Payer: Self-pay

## 2020-05-10 ENCOUNTER — Emergency Department (HOSPITAL_BASED_OUTPATIENT_CLINIC_OR_DEPARTMENT_OTHER)
Admission: EM | Admit: 2020-05-10 | Discharge: 2020-05-10 | Disposition: A | Discharge status: Home / Self Care | Payer: Medicaid Other | Attending: Emergency Medicine | Admitting: Emergency Medicine

## 2020-05-10 ENCOUNTER — Other Ambulatory Visit: Payer: Self-pay

## 2020-05-10 DIAGNOSIS — Z794 Long term (current) use of insulin: Secondary | ICD-10-CM | POA: Insufficient documentation

## 2020-05-10 DIAGNOSIS — F17210 Nicotine dependence, cigarettes, uncomplicated: Secondary | ICD-10-CM | POA: Insufficient documentation

## 2020-05-10 DIAGNOSIS — Z79899 Other long term (current) drug therapy: Secondary | ICD-10-CM | POA: Insufficient documentation

## 2020-05-10 DIAGNOSIS — Z20822 Contact with and (suspected) exposure to covid-19: Secondary | ICD-10-CM | POA: Insufficient documentation

## 2020-05-10 DIAGNOSIS — R05 Cough: Secondary | ICD-10-CM | POA: Diagnosis present

## 2020-05-10 DIAGNOSIS — E119 Type 2 diabetes mellitus without complications: Secondary | ICD-10-CM | POA: Insufficient documentation

## 2020-05-10 DIAGNOSIS — J069 Acute upper respiratory infection, unspecified: Secondary | ICD-10-CM

## 2020-05-10 DIAGNOSIS — Z1159 Encounter for screening for other viral diseases: Secondary | ICD-10-CM | POA: Insufficient documentation

## 2020-05-10 NOTE — ED Triage Notes (Signed)
Pt states wants to be tested for covid, states runny nose and cough since yesterday, has possible contact with a family member with possible covid.  Denies fever.  Took benadryl yesterday with some improvement of symptoms.

## 2020-05-10 NOTE — ED Provider Notes (Signed)
[facility] EMERGENCY DEPARTMENT Provider Note   CSN: [id number] Arrival date & time: 05/10/20  3159     History Chief Complaint  Patient presents with   Nasal Congestion   Cough    Victoria Shaffer is a 59 y.o. female.  HPI Patient reports that her daughter was exposed to someone with COVID-19.  Her daughter has tested negative thus far.  Patient reports she had some nasal congestion for 2 days.  She has had some headache at her temples.  She is concerned she might have Covid.  She has not been vaccinated.  She reports she smokes and she often has to clear her throat a little bit but she really has not had a cough or shortness of breath.  No chest pain.  No abdominal pain.  She reports she vomited once yesterday.  No diarrhea.  No lower extremity swelling or body aches.    Past Medical History:  Diagnosis Date   Diabetes mellitus     There are no problems to display for this patient.   Past Surgical History:  Procedure Laterality Date   carpel tunnel     CESAREAN SECTION       OB History   No obstetric history on file.     History reviewed. No pertinent family history.  Social History   Tobacco Use   Smoking status: Current Every Day Smoker    Types: Cigarettes  Substance Use Topics   Alcohol use: No   Drug use: No    Home Medications Prior to Admission medications   Medication Sig Start Date End Date Taking? Authorizing Provider  atorvastatin (LIPITOR) 20 MG tablet Take 20 mg by mouth daily.    [provider]  cephALEXin (KEFLEX) 500 MG capsule Take 1 capsule (500 mg total) by mouth 4 (four) times daily. 07/17/12   Molpus, John, MD  DULoxetine (CYMBALTA) 60 MG capsule Take 60 mg by mouth daily.    [provider]  glipiZIDE (GLUCOTROL XL) 10 MG 24 hr tablet Take 10 mg by mouth daily.    [provider]  HYDROcodone-acetaminophen (NORCO/VICODIN) 5-325 MG per tablet Take 1-2 tablets by mouth every 6 (six)  hours as needed for pain. 07/17/12   Molpus, John, MD  HYDROcodone-acetaminophen (NORCO/VICODIN) 5-325 MG per tablet Take 1-2 tablets by mouth every 6 (six) hours as needed. 04/16/14   Molpus, John, MD  HYDROcodone-acetaminophen (NORCO/VICODIN) 5-325 MG per tablet Take 1-2 tablets by mouth every 4 (four) hours as needed. 05/13/14   Elpidio Anis, PA-C  ibuprofen (ADVIL,MOTRIN) 800 MG tablet Take 1 tablet (800 mg total) by mouth 3 (three) times daily. 05/13/14   Elpidio Anis, PA-C  Insulin Aspart Prot & Aspart (NOVOLOG MIX 70/30 [place]) Inject into the skin.    [provider]  levothyroxine (SYNTHROID, LEVOTHROID) 100 MCG tablet Take 100 mcg by mouth daily before breakfast.    [provider]  metFORMIN (GLUCOPHAGE) 1000 MG tablet Take 1,000 mg by mouth 2 (two) times daily with a meal.    [provider]  methocarbamol (ROBAXIN) 500 MG tablet Take 1 tablet (500 mg total) by mouth 2 (two) times daily. 04/17/14   Emilia Beck, PA-C  oxyCODONE-acetaminophen (PERCOCET/ROXICET) 5-325 MG per tablet Take 1-2 tablets by mouth every 4 (four) hours as needed for moderate pain or severe pain. 04/17/14   Emilia Beck, PA-C  sitaGLIPtin (JANUVIA) 50 MG tablet Take 50 mg by mouth daily.    [provider]  traZODone (DESYREL)  100 MG tablet Take 100 mg by mouth at bedtime.    [provider]  venlafaxine XR (EFFEXOR-XR) 37.5 MG 24 hr capsule Take 37.5 mg by mouth. 05/10/14 05/10/15  [provider]    Allergies    Patient has no known allergies.  Review of Systems   Review of Systems 10 systems reviewed and negative except as per HPI Physical Exam Updated Vital Signs BP (!) 144/81 (BP Location: Right Arm)    Pulse 90    Temp 98.4 F (36.9 C) (Oral)    Resp 16    Ht 5\' 2"  (1.575 m)    Wt 63.5 kg    SpO2 100%    BMI 25.61 kg/m   Physical Exam Constitutional:      Appearance: She is well-developed.  HENT:     Head: Normocephalic and atraumatic.    Eyes:     Pupils: Pupils are equal, round, and reactive to light.  Cardiovascular:     Rate and Rhythm: Normal rate and regular rhythm.     Heart sounds: Normal heart sounds.  Pulmonary:     Effort: Pulmonary effort is normal.     Breath sounds: Normal breath sounds.  Abdominal:     General: Bowel sounds are normal. There is no distension.     Palpations: Abdomen is soft.     Tenderness: There is no abdominal tenderness.  Musculoskeletal:        General: Normal range of motion.     Cervical back: Neck supple.  Skin:    General: Skin is warm and dry.  Neurological:     Mental Status: She is alert and oriented to person, place, and time.     GCS: GCS eye subscore is 4. GCS verbal subscore is 5. GCS motor subscore is 6.     Coordination: Coordination normal.     ED Results / Procedures / Treatments   Labs (all labs ordered are listed, but only abnormal results are displayed) Labs Reviewed  SARS CORONAVIRUS 2 (TAT 6-24 HRS)    EKG None  Radiology No results found.  Procedures Procedures (including critical care time)  Medications Ordered in ED Medications - No data to display  ED Course  I have reviewed the triage vital signs and the nursing notes.  Pertinent labs & imaging results that were available during my care of the patient were reviewed by me and considered in my medical decision making (see chart for details).    MDM Rules/Calculators/A&P                          Patient is clinically well in appearance.  Physical exam is normal.  Oxygenation 100%.  Patient is concerned for possible Covid exposure.  Testing obtained.  Patient given precautionary statement.  Stable for discharge.  was evaluated in Emergency Department on 05/10/2020 for the symptoms described in the history of present illness. She was evaluated in the context of the global COVID-19 pandemic, which necessitated consideration that the patient might be at risk for infection  with the SARS-CoV-2 virus that causes COVID-19. Institutional protocols and algorithms that pertain to the evaluation of patients at risk for COVID-19 are in a state of rapid change based on information released by regulatory bodies including the CDC and federal and state organizations. These policies and algorithms were followed during the patient's care in the ED. Final Clinical Impression(s) / ED Diagnoses Final diagnoses:  Viral upper  respiratory tract infection  Encounter for laboratory testing for COVID-19 virus    Rx / DC Orders ED Discharge Orders    None       Arby Barrette, MD 05/10/20 [phone]

## 2020-05-11 LAB — SARS CORONAVIRUS 2 BY RT PCR (HOSPITAL ORDER, PERFORMED IN ~~LOC~~ HOSPITAL LAB): SARS Coronavirus 2: NEGATIVE

## 2020-10-21 ENCOUNTER — Other Ambulatory Visit: Payer: Self-pay

## 2020-10-21 ENCOUNTER — Encounter (HOSPITAL_BASED_OUTPATIENT_CLINIC_OR_DEPARTMENT_OTHER): Payer: Self-pay | Admitting: *Deleted

## 2020-10-21 DIAGNOSIS — Y92410 Unspecified street and highway as the place of occurrence of the external cause: Secondary | ICD-10-CM | POA: Insufficient documentation

## 2020-10-21 DIAGNOSIS — Z794 Long term (current) use of insulin: Secondary | ICD-10-CM | POA: Diagnosis not present

## 2020-10-21 DIAGNOSIS — S39012A Strain of muscle, fascia and tendon of lower back, initial encounter: Secondary | ICD-10-CM | POA: Diagnosis not present

## 2020-10-21 DIAGNOSIS — E162 Hypoglycemia, unspecified: Secondary | ICD-10-CM | POA: Insufficient documentation

## 2020-10-21 DIAGNOSIS — F17210 Nicotine dependence, cigarettes, uncomplicated: Secondary | ICD-10-CM | POA: Diagnosis not present

## 2020-10-21 DIAGNOSIS — R079 Chest pain, unspecified: Secondary | ICD-10-CM | POA: Insufficient documentation

## 2020-10-21 DIAGNOSIS — M25511 Pain in right shoulder: Secondary | ICD-10-CM | POA: Insufficient documentation

## 2020-10-21 DIAGNOSIS — M25512 Pain in left shoulder: Secondary | ICD-10-CM | POA: Diagnosis not present

## 2020-10-21 DIAGNOSIS — S34109A Unspecified injury to unspecified level of lumbar spinal cord, initial encounter: Secondary | ICD-10-CM | POA: Diagnosis present

## 2020-10-21 DIAGNOSIS — E119 Type 2 diabetes mellitus without complications: Secondary | ICD-10-CM | POA: Diagnosis not present

## 2020-10-21 DIAGNOSIS — R519 Headache, unspecified: Secondary | ICD-10-CM | POA: Insufficient documentation

## 2020-10-21 DIAGNOSIS — S161XXA Strain of muscle, fascia and tendon at neck level, initial encounter: Secondary | ICD-10-CM | POA: Diagnosis not present

## 2020-10-21 DIAGNOSIS — Z7984 Long term (current) use of oral hypoglycemic drugs: Secondary | ICD-10-CM | POA: Diagnosis not present

## 2020-10-21 LAB — CBG MONITORING, ED
Glucose-Capillary: 40 mg/dL — CL (ref 70–99)
Glucose-Capillary: 103 mg/dL — ABNORMAL HIGH (ref 70–99)
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
## 2020-10-21 NOTE — ED Notes (Signed)
Pt states she feels her blood sugar is low, checked and was 40. Given orange juice and graham crackers and peanut butter.

## 2020-10-22 ENCOUNTER — Emergency Department (HOSPITAL_BASED_OUTPATIENT_CLINIC_OR_DEPARTMENT_OTHER)
Admission: EM | Admit: 2020-10-22 | Discharge: 2020-10-22 | Disposition: A | Discharge status: Home / Self Care | Payer: Medicaid Other | Attending: Emergency Medicine | Admitting: Emergency Medicine

## 2020-10-22 ENCOUNTER — Other Ambulatory Visit (HOSPITAL_BASED_OUTPATIENT_CLINIC_OR_DEPARTMENT_OTHER): Payer: Self-pay | Admitting: Emergency Medicine

## 2020-10-22 DIAGNOSIS — E162 Hypoglycemia, unspecified: Secondary | ICD-10-CM

## 2020-10-22 DIAGNOSIS — S161XXA Strain of muscle, fascia and tendon at neck level, initial encounter: Secondary | ICD-10-CM

## 2020-10-22 DIAGNOSIS — S39012A Strain of muscle, fascia and tendon of lower back, initial encounter: Secondary | ICD-10-CM

## 2020-10-22 MED ORDER — CYCLOBENZAPRINE HCL 10 MG PO TABS: 10.0000 mg | ORAL_TABLET | Freq: Three times a day (TID) | ORAL | 0 refills | Status: AC | PRN

## 2020-10-22 MED ORDER — NAPROXEN 250 MG PO TABS
500.0000 mg | ORAL_TABLET | Freq: Once | ORAL | Status: AC
  Administered 2020-10-22: 500 mg via ORAL
  Filled 2020-10-22: qty 2

## 2020-10-22 MED ORDER — NAPROXEN 375 MG PO TABS: ORAL_TABLET | ORAL | 0 refills | Status: DC

## 2020-10-22 MED FILL — NAPROXEN 375 MG TABLET: 375 | 10 days supply | Qty: 20 | Fill #0
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
## 2024-01-17 ENCOUNTER — Other Ambulatory Visit: Payer: Self-pay

## 2024-01-17 ENCOUNTER — Emergency Department (HOSPITAL_BASED_OUTPATIENT_CLINIC_OR_DEPARTMENT_OTHER)
Admission: EM | Admit: 2024-01-17 | Discharge: 2024-01-17 | Disposition: A | Discharge status: Home / Self Care | Attending: Emergency Medicine | Admitting: Emergency Medicine

## 2024-01-17 ENCOUNTER — Emergency Department (HOSPITAL_BASED_OUTPATIENT_CLINIC_OR_DEPARTMENT_OTHER)

## 2024-01-17 ENCOUNTER — Other Ambulatory Visit (HOSPITAL_BASED_OUTPATIENT_CLINIC_OR_DEPARTMENT_OTHER): Payer: Self-pay

## 2024-01-17 ENCOUNTER — Encounter (HOSPITAL_BASED_OUTPATIENT_CLINIC_OR_DEPARTMENT_OTHER): Payer: Self-pay | Admitting: Emergency Medicine

## 2024-01-17 DIAGNOSIS — R519 Headache, unspecified: Secondary | ICD-10-CM | POA: Diagnosis not present

## 2024-01-17 DIAGNOSIS — Z7984 Long term (current) use of oral hypoglycemic drugs: Secondary | ICD-10-CM | POA: Insufficient documentation

## 2024-01-17 DIAGNOSIS — R110 Nausea: Secondary | ICD-10-CM | POA: Diagnosis not present

## 2024-01-17 DIAGNOSIS — M542 Cervicalgia: Secondary | ICD-10-CM | POA: Insufficient documentation

## 2024-01-17 DIAGNOSIS — M533 Sacrococcygeal disorders, not elsewhere classified: Secondary | ICD-10-CM | POA: Diagnosis not present

## 2024-01-17 DIAGNOSIS — M545 Low back pain, unspecified: Secondary | ICD-10-CM | POA: Insufficient documentation

## 2024-01-17 DIAGNOSIS — E119 Type 2 diabetes mellitus without complications: Secondary | ICD-10-CM | POA: Insufficient documentation

## 2024-01-17 DIAGNOSIS — Y92410 Unspecified street and highway as the place of occurrence of the external cause: Secondary | ICD-10-CM | POA: Insufficient documentation

## 2024-01-17 LAB — URINALYSIS, ROUTINE W REFLEX MICROSCOPIC
Bilirubin Urine: NEGATIVE
Glucose, UA: >=500 mg/dL — AB
Hgb urine dipstick: NEGATIVE
Ketones, ur: NEGATIVE mg/dL
Nitrite: NEGATIVE
Protein, ur: NEGATIVE mg/dL
Specific Gravity, Urine: 1.01 (ref 1.005–1.030)
pH: 5.5 (ref 5.0–8.0)

## 2024-01-17 LAB — I-STAT VENOUS BLOOD GAS, ED
Acid-base deficit: 1 mmol/L (ref 0.0–2.0)
Bicarbonate: 25.6 mmol/L (ref 20.0–28.0)
Calcium, Ion: 1.24 mmol/L (ref 1.15–1.40)
HCT: 38 % (ref 36.0–46.0)
Hemoglobin: 12.9 g/dL (ref 12.0–15.0)
O2 Saturation: 31 %
Patient temperature: 98.2
Potassium: 5.2 mmol/L — ABNORMAL HIGH (ref 3.5–5.1)
Sodium: 134 mmol/L — ABNORMAL LOW (ref 135–145)
TCO2: 27 mmol/L (ref 22–32)
pCO2, Ven: 48 mmHg (ref 44–60)
pH, Ven: 7.334 (ref 7.25–7.43)
pO2, Ven: 21 mmHg — CL (ref 32–45)

## 2024-01-17 LAB — URINALYSIS, MICROSCOPIC (REFLEX)

## 2024-01-17 LAB — BETA-HYDROXYBUTYRIC ACID: Beta-Hydroxybutyric Acid: 0.07 mmol/L (ref 0.05–0.27)

## 2024-01-17 LAB — GLUCOSE, CAPILLARY: Glucose-Capillary: 308 mg/dL — ABNORMAL HIGH (ref 70–99)

## 2024-01-17 MED ORDER — ONDANSETRON 4 MG PO TBDP
8.0000 mg | ORAL_TABLET | Freq: Once | ORAL | Status: AC
  Administered 2024-01-17: 8 mg via ORAL
  Filled 2024-01-17: qty 2

## 2024-01-17 MED ORDER — METAXALONE 800 MG PO TABS
800.0000 mg | ORAL_TABLET | Freq: Three times a day (TID) | ORAL | 0 refills | Status: DC
  Filled 2024-01-17: qty 21, 7d supply, fill #0

## 2024-01-17 MED ORDER — OXYCODONE-ACETAMINOPHEN 5-325 MG PO TABS
1.0000 | ORAL_TABLET | Freq: Once | ORAL | Status: AC
  Administered 2024-01-17: 1 via ORAL
  Filled 2024-01-17: qty 1

## 2024-01-17 MED ORDER — METAXALONE 800 MG PO TABS: 800.0000 mg | ORAL_TABLET | Freq: Three times a day (TID) | ORAL | 0 refills | Status: AC
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
## 2024-01-17 NOTE — ED Notes (Signed)
 C-collar placed on pt.

## 2024-01-18 ENCOUNTER — Other Ambulatory Visit (HOSPITAL_BASED_OUTPATIENT_CLINIC_OR_DEPARTMENT_OTHER): Payer: Self-pay
